# Patient Record
Sex: Female | Born: 1965 | Race: Black or African American | Hispanic: No | Marital: Single | Smoking: Current every day smoker
Health system: Southern US, Community
[De-identification: ages and names within clinical notes are randomized; demographics above are authoritative.]

## PROBLEM LIST (undated history)

## (undated) DIAGNOSIS — C210 Malignant neoplasm of anus, unspecified: Secondary | ICD-10-CM

## (undated) DIAGNOSIS — F32A Depression, unspecified: Secondary | ICD-10-CM

## (undated) DIAGNOSIS — M1990 Unspecified osteoarthritis, unspecified site: Secondary | ICD-10-CM

## (undated) DIAGNOSIS — F1110 Opioid abuse, uncomplicated: Secondary | ICD-10-CM

## (undated) DIAGNOSIS — Z720 Tobacco use: Secondary | ICD-10-CM

## (undated) DIAGNOSIS — M797 Fibromyalgia: Secondary | ICD-10-CM

## (undated) DIAGNOSIS — C801 Malignant (primary) neoplasm, unspecified: Secondary | ICD-10-CM

## (undated) DIAGNOSIS — I10 Essential (primary) hypertension: Secondary | ICD-10-CM

## (undated) DIAGNOSIS — F329 Major depressive disorder, single episode, unspecified: Secondary | ICD-10-CM

## (undated) HISTORY — PX: OTHER SURGICAL HISTORY: SHX169

---

## 2000-04-16 ENCOUNTER — Encounter: Admission: RE | Admit: 2000-04-16 | Discharge: 2000-04-16 | Payer: Self-pay | Admitting: General Surgery

## 2000-04-16 ENCOUNTER — Encounter: Payer: Self-pay | Admitting: General Surgery

## 2000-11-29 ENCOUNTER — Emergency Department (HOSPITAL_COMMUNITY): Admission: EM | Admit: 2000-11-29 | Discharge: 2000-11-29 | Payer: Self-pay | Admitting: Emergency Medicine

## 2001-07-28 ENCOUNTER — Encounter: Payer: Self-pay | Admitting: Emergency Medicine

## 2001-07-28 ENCOUNTER — Emergency Department (HOSPITAL_COMMUNITY): Admission: EM | Admit: 2001-07-28 | Discharge: 2001-07-28 | Payer: Self-pay | Admitting: Emergency Medicine

## 2001-09-02 ENCOUNTER — Emergency Department (HOSPITAL_COMMUNITY): Admission: EM | Admit: 2001-09-02 | Discharge: 2001-09-02 | Payer: Self-pay | Admitting: Emergency Medicine

## 2002-02-17 ENCOUNTER — Emergency Department (HOSPITAL_COMMUNITY): Admission: EM | Admit: 2002-02-17 | Discharge: 2002-02-17 | Payer: Self-pay | Admitting: Emergency Medicine

## 2004-12-18 ENCOUNTER — Other Ambulatory Visit: Admission: RE | Admit: 2004-12-18 | Discharge: 2004-12-18 | Payer: Self-pay | Admitting: Obstetrics & Gynecology

## 2005-12-30 ENCOUNTER — Other Ambulatory Visit: Admission: RE | Admit: 2005-12-30 | Discharge: 2005-12-30 | Payer: Self-pay | Admitting: Obstetrics & Gynecology

## 2007-01-25 ENCOUNTER — Other Ambulatory Visit: Admission: RE | Admit: 2007-01-25 | Discharge: 2007-01-25 | Payer: Self-pay | Admitting: Family Medicine

## 2007-02-03 ENCOUNTER — Encounter: Admission: RE | Admit: 2007-02-03 | Discharge: 2007-02-03 | Payer: Self-pay | Admitting: Family Medicine

## 2007-03-04 ENCOUNTER — Encounter: Admission: RE | Admit: 2007-03-04 | Discharge: 2007-03-04 | Payer: Self-pay | Admitting: Family Medicine

## 2009-01-29 ENCOUNTER — Other Ambulatory Visit: Admission: RE | Admit: 2009-01-29 | Discharge: 2009-01-29 | Payer: Self-pay | Admitting: Family Medicine

## 2010-05-17 ENCOUNTER — Ambulatory Visit (HOSPITAL_COMMUNITY): Admission: RE | Admit: 2010-05-17 | Discharge: 2010-05-17 | Payer: Self-pay | Admitting: Obstetrics & Gynecology

## 2010-07-24 ENCOUNTER — Other Ambulatory Visit: Admission: RE | Admit: 2010-07-24 | Discharge: 2010-07-24 | Payer: Self-pay | Admitting: Family Medicine

## 2011-01-11 LAB — CBC
HCT: 28.2 % — ABNORMAL LOW (ref 36.0–46.0)
Hemoglobin: 9.3 g/dL — ABNORMAL LOW (ref 12.0–15.0)
MCH: 27.6 pg (ref 26.0–34.0)
MCHC: 33 g/dL (ref 30.0–36.0)
MCV: 83.6 fL (ref 78.0–100.0)
Platelets: 224 10*3/uL (ref 150–400)
RBC: 3.37 MIL/uL — ABNORMAL LOW (ref 3.87–5.11)
RDW: 14.2 % (ref 11.5–15.5)
WBC: 5.4 10*3/uL (ref 4.0–10.5)

## 2011-02-21 ENCOUNTER — Other Ambulatory Visit: Payer: Self-pay | Admitting: Family Medicine

## 2011-02-21 DIAGNOSIS — Z1231 Encounter for screening mammogram for malignant neoplasm of breast: Secondary | ICD-10-CM

## 2011-02-24 ENCOUNTER — Ambulatory Visit
Admission: RE | Admit: 2011-02-24 | Discharge: 2011-02-24 | Disposition: A | Discharge status: Home / Self Care | Payer: 59 | Source: Ambulatory Visit | Attending: Family Medicine | Admitting: Family Medicine

## 2011-02-24 DIAGNOSIS — Z1231 Encounter for screening mammogram for malignant neoplasm of breast: Secondary | ICD-10-CM

## 2011-02-26 ENCOUNTER — Other Ambulatory Visit: Payer: Self-pay | Admitting: Family Medicine

## 2011-02-26 DIAGNOSIS — R928 Other abnormal and inconclusive findings on diagnostic imaging of breast: Secondary | ICD-10-CM

## 2011-03-03 ENCOUNTER — Ambulatory Visit
Admission: RE | Admit: 2011-03-03 | Discharge: 2011-03-03 | Disposition: A | Discharge status: Home / Self Care | Payer: 59 | Source: Ambulatory Visit | Attending: Family Medicine | Admitting: Family Medicine

## 2011-03-03 ENCOUNTER — Other Ambulatory Visit: Payer: Self-pay | Admitting: Family Medicine

## 2011-03-03 DIAGNOSIS — R928 Other abnormal and inconclusive findings on diagnostic imaging of breast: Secondary | ICD-10-CM

## 2011-06-09 ENCOUNTER — Inpatient Hospital Stay (INDEPENDENT_AMBULATORY_CARE_PROVIDER_SITE_OTHER)
Admission: RE | Admit: 2011-06-09 | Discharge: 2011-06-09 | Disposition: A | Discharge status: Home / Self Care | Payer: 59 | Source: Ambulatory Visit | Attending: Emergency Medicine | Admitting: Emergency Medicine

## 2011-06-09 DIAGNOSIS — R5381 Other malaise: Secondary | ICD-10-CM

## 2011-06-09 DIAGNOSIS — R5383 Other fatigue: Secondary | ICD-10-CM

## 2011-06-09 DIAGNOSIS — F411 Generalized anxiety disorder: Secondary | ICD-10-CM

## 2011-06-09 LAB — POCT I-STAT, CHEM 8
BUN: 12 mg/dL (ref 6–23)
Calcium, Ion: 1.13 mmol/L (ref 1.12–1.32)
HCT: 38 % (ref 36.0–46.0)
Hemoglobin: 12.9 g/dL (ref 12.0–15.0)
Sodium: 139 mEq/L (ref 135–145)
TCO2: 24 mmol/L (ref 0–100)

## 2011-06-09 LAB — POCT URINALYSIS DIP (DEVICE)
Ketones, ur: NEGATIVE mg/dL
Leukocytes, UA: NEGATIVE
Nitrite: NEGATIVE
Protein, ur: NEGATIVE mg/dL
pH: 5.5 (ref 5.0–8.0)

## 2011-08-21 ENCOUNTER — Other Ambulatory Visit: Payer: Self-pay | Admitting: Family Medicine

## 2011-08-21 DIAGNOSIS — R921 Mammographic calcification found on diagnostic imaging of breast: Secondary | ICD-10-CM

## 2011-10-14 ENCOUNTER — Ambulatory Visit
Admission: RE | Admit: 2011-10-14 | Discharge: 2011-10-14 | Disposition: A | Discharge status: Home / Self Care | Payer: 59 | Source: Ambulatory Visit | Attending: Family Medicine | Admitting: Family Medicine

## 2011-10-14 ENCOUNTER — Other Ambulatory Visit: Payer: Self-pay | Admitting: Family Medicine

## 2011-10-14 DIAGNOSIS — R921 Mammographic calcification found on diagnostic imaging of breast: Secondary | ICD-10-CM

## 2011-10-27 ENCOUNTER — Ambulatory Visit
Admission: RE | Admit: 2011-10-27 | Discharge: 2011-10-27 | Disposition: A | Discharge status: Home / Self Care | Payer: 59 | Source: Ambulatory Visit | Attending: Family Medicine | Admitting: Family Medicine

## 2011-10-27 DIAGNOSIS — R921 Mammographic calcification found on diagnostic imaging of breast: Secondary | ICD-10-CM

## 2012-04-15 ENCOUNTER — Other Ambulatory Visit: Payer: Self-pay | Admitting: Family Medicine

## 2012-04-15 DIAGNOSIS — R921 Mammographic calcification found on diagnostic imaging of breast: Secondary | ICD-10-CM

## 2012-06-30 ENCOUNTER — Ambulatory Visit
Admission: RE | Admit: 2012-06-30 | Discharge: 2012-06-30 | Disposition: A | Discharge status: Home / Self Care | Payer: 59 | Source: Ambulatory Visit | Attending: Family Medicine | Admitting: Family Medicine

## 2012-06-30 DIAGNOSIS — R921 Mammographic calcification found on diagnostic imaging of breast: Secondary | ICD-10-CM

## 2012-07-19 ENCOUNTER — Other Ambulatory Visit (HOSPITAL_COMMUNITY)
Admission: RE | Admit: 2012-07-19 | Discharge: 2012-07-19 | Disposition: A | Discharge status: Home / Self Care | Payer: 59 | Source: Ambulatory Visit | Attending: Family Medicine | Admitting: Family Medicine

## 2012-07-19 ENCOUNTER — Other Ambulatory Visit: Payer: Self-pay | Admitting: Family Medicine

## 2012-07-19 DIAGNOSIS — Z124 Encounter for screening for malignant neoplasm of cervix: Secondary | ICD-10-CM | POA: Insufficient documentation

## 2012-09-22 ENCOUNTER — Encounter (HOSPITAL_COMMUNITY): Payer: Self-pay | Admitting: *Deleted

## 2012-09-22 ENCOUNTER — Emergency Department (HOSPITAL_COMMUNITY): Payer: 59

## 2012-09-22 ENCOUNTER — Observation Stay (HOSPITAL_COMMUNITY)
Admission: EM | Admit: 2012-09-22 | Discharge: 2012-09-22 | Disposition: A | Discharge status: Home / Self Care | Payer: 59 | Attending: Internal Medicine | Admitting: Internal Medicine

## 2012-09-22 ENCOUNTER — Encounter (HOSPITAL_COMMUNITY)
Admission: EM | Disposition: A | Discharge status: Home / Self Care | Payer: Self-pay | Source: Home / Self Care | Attending: Emergency Medicine

## 2012-09-22 DIAGNOSIS — Z720 Tobacco use: Secondary | ICD-10-CM | POA: Diagnosis present

## 2012-09-22 DIAGNOSIS — R079 Chest pain, unspecified: Secondary | ICD-10-CM

## 2012-09-22 DIAGNOSIS — Z8249 Family history of ischemic heart disease and other diseases of the circulatory system: Secondary | ICD-10-CM

## 2012-09-22 DIAGNOSIS — R0789 Other chest pain: Principal | ICD-10-CM | POA: Insufficient documentation

## 2012-09-22 DIAGNOSIS — F17200 Nicotine dependence, unspecified, uncomplicated: Secondary | ICD-10-CM | POA: Insufficient documentation

## 2012-09-22 HISTORY — DX: Tobacco use: Z72.0

## 2012-09-22 HISTORY — DX: Unspecified osteoarthritis, unspecified site: M19.90

## 2012-09-22 HISTORY — DX: Fibromyalgia: M79.7

## 2012-09-22 HISTORY — PX: LEFT HEART CATHETERIZATION WITH CORONARY ANGIOGRAM: SHX5451

## 2012-09-22 LAB — LIPID PANEL
Cholesterol: 143 mg/dL (ref 0–200)
Triglycerides: 55 mg/dL (ref <150)
VLDL: 11 mg/dL (ref 0–40)

## 2012-09-22 LAB — CBC
HCT: 34.2 % — ABNORMAL LOW (ref 36.0–46.0)
Hemoglobin: 11.7 g/dL — ABNORMAL LOW (ref 12.0–15.0)
MCH: 29.5 pg (ref 26.0–34.0)
MCHC: 34.2 g/dL (ref 30.0–36.0)
MCV: 86.4 fL (ref 78.0–100.0)

## 2012-09-22 LAB — COMPREHENSIVE METABOLIC PANEL
ALT: 16 U/L (ref 0–35)
AST: 27 U/L (ref 0–37)
Albumin: 3.8 g/dL (ref 3.5–5.2)
Alkaline Phosphatase: 64 U/L (ref 39–117)
Calcium: 9.6 mg/dL (ref 8.4–10.5)
GFR calc Af Amer: 82 mL/min — ABNORMAL LOW (ref >90)
Potassium: 3.8 mEq/L (ref 3.5–5.1)
Sodium: 139 mEq/L (ref 135–145)
Total Protein: 7 g/dL (ref 6.0–8.3)

## 2012-09-22 LAB — TROPONIN I
Troponin I: <0.3 ng/mL (ref <0.30)
Troponin I: <0.3 ng/mL (ref <0.30)
Troponin I: <0.3 ng/mL (ref <0.30)

## 2012-09-22 LAB — CBC WITH DIFFERENTIAL/PLATELET
Basophils Absolute: 0 10*3/uL (ref 0.0–0.1)
Basophils Relative: 0 % (ref 0–1)
Eosinophils Relative: 4 % (ref 0–5)
HCT: 38.5 % (ref 36.0–46.0)
Hemoglobin: 12.9 g/dL (ref 12.0–15.0)
MCHC: 33.5 g/dL (ref 30.0–36.0)
MCV: 86.9 fL (ref 78.0–100.0)
Monocytes Absolute: 0.6 10*3/uL (ref 0.1–1.0)
Monocytes Relative: 8 % (ref 3–12)
Neutro Abs: 3.8 10*3/uL (ref 1.7–7.7)
RDW: 14.1 % (ref 11.5–15.5)

## 2012-09-22 LAB — PROTIME-INR: INR: 0.98 (ref 0.00–1.49)

## 2012-09-22 LAB — CREATININE, SERUM
GFR calc Af Amer: >90 mL/min (ref >90)
GFR calc non Af Amer: 80 mL/min — ABNORMAL LOW (ref >90)

## 2012-09-22 SURGERY — LEFT HEART CATHETERIZATION WITH CORONARY ANGIOGRAM
Anesthesia: LOCAL

## 2012-09-22 MED ORDER — NITROGLYCERIN 2 % TD OINT
0.5000 [in_us] | TOPICAL_OINTMENT | Freq: Four times a day (QID) | TRANSDERMAL | Status: DC
  Filled 2012-09-22: qty 30

## 2012-09-22 MED ORDER — ONDANSETRON HCL 4 MG/2ML IJ SOLN: 4.0000 mg | Freq: Four times a day (QID) | INTRAMUSCULAR | Status: DC | PRN

## 2012-09-22 MED ORDER — METOPROLOL TARTRATE 25 MG PO TABS: 12.5000 mg | ORAL_TABLET | Freq: Two times a day (BID) | ORAL | Status: DC

## 2012-09-22 MED ORDER — METOPROLOL TARTRATE 25 MG PO TABS
25.0000 mg | ORAL_TABLET | Freq: Two times a day (BID) | ORAL | Status: DC
  Administered 2012-09-22: 25 mg via ORAL
  Filled 2012-09-22 (×2): qty 1

## 2012-09-22 MED ORDER — SODIUM CHLORIDE 0.9 % IJ SOLN: 3.0000 mL | INTRAMUSCULAR | Status: DC | PRN

## 2012-09-22 MED ORDER — SODIUM CHLORIDE 0.9 % IJ SOLN
3.0000 mL | INTRAMUSCULAR | Status: DC | PRN
  Administered 2012-09-22: 3 mL via INTRAVENOUS

## 2012-09-22 MED ORDER — MIDAZOLAM HCL 2 MG/2ML IJ SOLN
INTRAMUSCULAR | Status: AC
  Filled 2012-09-22: qty 2

## 2012-09-22 MED ORDER — HEPARIN SODIUM (PORCINE) 5000 UNIT/ML IJ SOLN
5000.0000 [IU] | Freq: Three times a day (TID) | INTRAMUSCULAR | Status: DC
  Filled 2012-09-22 (×4): qty 1

## 2012-09-22 MED ORDER — ACETAMINOPHEN 325 MG PO TABS: 650.0000 mg | ORAL_TABLET | ORAL | Status: DC | PRN

## 2012-09-22 MED ORDER — SODIUM CHLORIDE 0.9 % IJ SOLN: 3.0000 mL | Freq: Two times a day (BID) | INTRAMUSCULAR | Status: DC

## 2012-09-22 MED ORDER — ASPIRIN 81 MG PO TBEC: 81.0000 mg | DELAYED_RELEASE_TABLET | Freq: Every day | ORAL | Status: DC

## 2012-09-22 MED ORDER — SODIUM CHLORIDE 0.9 % IV SOLN
250.0000 mL | INTRAVENOUS | Status: DC
  Administered 2012-09-22: 250 mL via INTRAVENOUS

## 2012-09-22 MED ORDER — FENTANYL CITRATE 0.05 MG/ML IJ SOLN
INTRAMUSCULAR | Status: AC
Start: 2012-09-22 — End: 2012-09-22
  Filled 2012-09-22: qty 2

## 2012-09-22 MED ORDER — DIAZEPAM 5 MG PO TABS
5.0000 mg | ORAL_TABLET | ORAL | Status: AC
  Administered 2012-09-22: 5 mg via ORAL
  Filled 2012-09-22: qty 1

## 2012-09-22 MED ORDER — ASPIRIN 300 MG RE SUPP: 300.0000 mg | RECTAL | Status: AC

## 2012-09-22 MED ORDER — SODIUM CHLORIDE 0.9 % IV SOLN: 250.0000 mL | INTRAVENOUS | Status: DC | PRN

## 2012-09-22 MED ORDER — NITROGLYCERIN 0.4 MG SL SUBL
0.4000 mg | SUBLINGUAL_TABLET | Freq: Once | SUBLINGUAL | Status: AC
  Administered 2012-09-22: 0.4 mg via SUBLINGUAL

## 2012-09-22 MED ORDER — ASPIRIN EC 81 MG PO TBEC: 81.0000 mg | DELAYED_RELEASE_TABLET | Freq: Every day | ORAL | Status: DC

## 2012-09-22 MED ORDER — LORAZEPAM 1 MG PO TABS: 1.0000 mg | ORAL_TABLET | Freq: Every evening | ORAL | Status: DC | PRN

## 2012-09-22 MED ORDER — SODIUM CHLORIDE 0.9 % IV SOLN: INTRAVENOUS | Status: DC

## 2012-09-22 MED ORDER — PREGABALIN 75 MG PO CAPS
75.0000 mg | ORAL_CAPSULE | Freq: Two times a day (BID) | ORAL | Status: DC
  Administered 2012-09-22 (×2): 75 mg via ORAL
  Filled 2012-09-22 (×2): qty 1

## 2012-09-22 MED ORDER — NITROGLYCERIN 0.4 MG SL SUBL: 0.4000 mg | SUBLINGUAL_TABLET | SUBLINGUAL | Status: DC | PRN

## 2012-09-22 MED ORDER — ASPIRIN 81 MG PO CHEW
324.0000 mg | CHEWABLE_TABLET | ORAL | Status: AC
  Administered 2012-09-22: 324 mg via ORAL
  Filled 2012-09-22: qty 4

## 2012-09-22 MED ORDER — CLONAZEPAM 1 MG PO TABS: 1.0000 mg | ORAL_TABLET | Freq: Two times a day (BID) | ORAL | Status: DC | PRN

## 2012-09-22 MED ORDER — NICOTINE 21 MG/24HR TD PT24
21.0000 mg | MEDICATED_PATCH | Freq: Every day | TRANSDERMAL | Status: DC
  Filled 2012-09-22 (×2): qty 1

## 2012-09-22 NOTE — ED Notes (Signed)
Attempted to call report x1, stated they would call back 

## 2012-09-22 NOTE — ED Provider Notes (Signed)
History     CSN: [id number]  Arrival date & time 09/22/12  0051   First MD Initiated Contact with Patient 09/22/12 0056      Chief Complaint  Patient presents with  . Chest Pain    (Consider location/radiation/quality/duration/timing/severity/associated sxs/prior treatment) Patient is a 46 y.o. female presenting with chest pain. The history is provided by the patient.  Chest Pain Primary symptoms include nausea. Pertinent negatives for primary symptoms include no shortness of breath, no abdominal pain and no vomiting.  Pertinent negatives for associated symptoms include no numbness and no weakness.    patient woke with chest pain tonight. She states it radiates to her left arm. He's had a little bit of nausea. She describes it as a pressure. She has not had pains like this before. She does smoke. No trouble breathing. No recent travel. She did have a cold a couple weeks ago. She states that has cleared up. She has a strong family history of early cardiac disease. She herself has never been seen for chest pain or other cardiac problems. Pain is not worse with movement or deep breath. No swelling or legs. No recent travel.  Past Medical History  Diagnosis Date  . Fibromyalgia   . Arthritis     Past Surgical History  Procedure Date  . Uterine ablation     Family History  Problem Relation Age of Onset  . Heart attack      History  Substance Use Topics  . Smoking status: Current Every Day Smoker -- 1.0 packs/day    Types: Cigarettes  . Smokeless tobacco: Not on file  . Alcohol Use: No    OB History    Grav Para Term Preterm Abortions TAB SAB Ect Mult Living                  Review of Systems  Constitutional: Negative for activity change and appetite change.  HENT: Negative for neck stiffness.   Eyes: Negative for pain.  Respiratory: Negative for chest tightness and shortness of breath.   Cardiovascular: Positive for chest pain. Negative for leg swelling.    Gastrointestinal: Positive for nausea. Negative for vomiting, abdominal pain and diarrhea.  Genitourinary: Negative for flank pain.  Musculoskeletal: Negative for back pain.  Skin: Negative for rash.  Neurological: Negative for weakness, numbness and headaches.  Psychiatric/Behavioral: Negative for behavioral problems.    Allergies  Vicodin  Home Medications   Current Outpatient Rx  Name  Route  Sig  Dispense  Refill  . CLONAZEPAM 1 MG PO TABS   Oral   Take 1 mg by mouth 2 (two) times daily as needed. For anxiety         . LORAZEPAM 1 MG PO TABS   Oral   Take 1 mg by mouth at bedtime as needed. For sleep         . PREGABALIN 75 MG PO CAPS   Oral   Take 75 mg by mouth 2 (two) times daily.           BP 116/78  Pulse 79  Temp 97.6 F (36.4 C) (Oral)  Resp 19  Ht 5' 8.25" (1.734 m)  Wt 156 lb (70.761 kg)  BMI 23.55 kg/m2  SpO2 97%  Physical Exam  Nursing note and vitals reviewed. Constitutional: She is oriented to person, place, and time. She appears well-developed and well-nourished.  HENT:  Head: Normocephalic and atraumatic.  Eyes: EOM are normal. Pupils are equal, round, and reactive to  light.  Neck: Normal range of motion. Neck supple.  Cardiovascular: Normal rate, regular rhythm and normal heart sounds.   No murmur heard. Pulmonary/Chest: Effort normal and breath sounds normal. No respiratory distress. She has no wheezes. She has no rales.  Abdominal: Soft. Bowel sounds are normal. She exhibits no distension. There is no tenderness. There is no rebound and no guarding.  Musculoskeletal: Normal range of motion.  Neurological: She is alert and oriented to person, place, and time. No cranial nerve deficit.  Skin: Skin is warm and dry.  Psychiatric: She has a normal mood and affect. Her speech is normal.    ED Course  Procedures (including critical care time)  Labs Reviewed  COMPREHENSIVE METABOLIC PANEL - Abnormal; Notable for the following:    GFR  calc non Af Amer 71 (*)     GFR calc Af Amer 82 (*)     All other components within normal limits  CBC WITH DIFFERENTIAL - Abnormal; Notable for the following:    Platelets 147 (*)     All other components within normal limits  POCT I-STAT TROPONIN I   Dg Chest 2 View  09/22/2012  *RADIOLOGY REPORT*  Clinical Data: Left-sided chest pain.  CHEST - 2 VIEW  Comparison: Chest radiograph performed 03/04/2007  Findings: The lungs are well-aerated and clear.  There is no evidence of focal opacification, pleural effusion or pneumothorax.  The heart is normal in size; the mediastinal contour is within normal limits.  No acute osseous abnormalities are seen.  IMPRESSION: No acute cardiopulmonary process seen.   Original Report Authenticated By: Tonia Ghent, M.D.      1. Chest pain      Date: 09/22/2012  Rate: 78  Rhythm: normal sinus rhythm  QRS Axis: normal  Intervals: normal  ST/T Wave abnormalities: normal  Conduction Disutrbances:none  Narrative Interpretation:   Old EKG Reviewed: none available    MDM  Patient with chest pain. EKG is reassuring. Strong family history of early cardiac disease. She's also smoker. She is now pain-free after nitroglycerin. She'll be admitted to medicine.         Juliet Rude. Rubin Payor, MD 09/22/12 1610

## 2012-09-22 NOTE — Progress Notes (Signed)
[facility] PROGRESS NOTE  Bonnie Douglas EAV:[id number] DOB: 08-28-1966 DOA: 09/22/2012 PCP: No primary provider on file.  Assessment/Plan  CP  Resolved. Appreciate [facility] consultation Patient going for Cardiac Cath today. If Cath is clean patient desires discharge today (even if its midnight tonight)  Tobacco Abuse Counseled to quit Nicotine Patch inpatient.  Constipation Patient reports this is her normal Stool softeners when able to eat.  Code Status: full code Family Communication:  Disposition Plan: To home when appropriate.   Consultants:  [facility]  Procedures:  Considering Cath.     HPI/Subjective: Bonnie Douglas is a 46 y.o. female who presented to ED with c/o chest pain-described as chest pressure. Onset 09/22/12 at 1230 AM that woke her up from sleep. Pressure like quality, located in L chest with radiation to L arm and L jaw, improved with 1 NTG in ED. Associated symptoms of nausea, mild diaphoresis, no SOB. Currently no chest pain but Lt shoulder pain.  Patients cardiac risk factors are significant and include a 1 PPD smoking habit since age 45, and a strong family history of CAD including a father with MI at age 12, and a brother who had 3 vessel CABG at age 35 (1 year older than patient is now). Brothers CAD presented as triple vessel disease and was diagnosed via heart cath after he had a normal stress test. Her maternal uncle with CAD   Objective: Filed Vitals:   09/22/12 0300 09/22/12 0315 09/22/12 0330 09/22/12 0406  BP: 124/85 115/74 118/78 126/83  Pulse: 76 76 69 72  Temp:    98.5 F (36.9 C)  TempSrc:    Oral  Resp: 16 13 13    Height:    5' 8.25" (1.734 m)  Weight:    70.761 kg (156 lb)  SpO2: 98% 97% 98% 100%    Intake/Output Summary (Last 24 hours) at 09/22/12 0904 Last data filed at 09/22/12 0843  Gross per 24 hour  Intake    250 ml  Output      0 ml  Net    250 ml   Filed Weights   09/22/12 0055 09/22/12 0406  Weight: 70.761 kg  (156 lb) 70.761 kg (156 lb)    Exam:  General:  A&O, NAD, Sitting up in bed. Cardiovascular: RRR, no M/R/G, No lower extremity edema Respiratory: CTA, no W/C/R Abdomen: Thin, soft, NT, ND, No masses Neuro:  Non focal, CN 2 - 12 grossly intact Psych:  A&O, Cooperative, Moderately groomed,  Appears slightly anxious, slightly agitated that she cant smoke.   Data Reviewed: Basic Metabolic Panel:  Lab 09/22/12 7829 09/22/12 0101  NA -- 139  K -- 3.8  CL -- 104  CO2 -- 25  GLUCOSE -- 90  BUN -- 18  CREATININE 0.85 0.94  CALCIUM -- 9.6  MG -- --  PHOS -- --   Liver Function Tests:  Lab 09/22/12 0101  AST 27  ALT 16  ALKPHOS 64  BILITOT 0.3  PROT 7.0  ALBUMIN 3.8   CBC:  Lab 09/22/12 0318 09/22/12 0101  WBC 6.3 8.0  NEUTROABS -- 3.8  HGB 11.7* 12.9  HCT 34.2* 38.5  MCV 86.4 86.9  PLT 137* 147*   Cardiac Enzymes:  Lab 09/22/12 0317  CKTOTAL --  CKMB --  CKMBINDEX --  TROPONINI <0.30     Studies: Dg Chest 2 View  09/22/2012  *RADIOLOGY REPORT*  Clinical Data: Left-sided chest pain.  CHEST - 2 VIEW  Comparison: Chest radiograph performed  03/04/2007  Findings: The lungs are well-aerated and clear.  There is no evidence of focal opacification, pleural effusion or pneumothorax.  The heart is normal in size; the mediastinal contour is within normal limits.  No acute osseous abnormalities are seen.  IMPRESSION: No acute cardiopulmonary process seen.   Original Report Authenticated By: Tonia Ghent, M.D.     Scheduled Meds:   . [COMPLETED] aspirin  324 mg Oral NOW   Or  . [COMPLETED] aspirin  300 mg Rectal NOW  . aspirin EC  81 mg Oral Daily  . heparin  5,000 Units Subcutaneous Q8H  . [COMPLETED] nitroGLYCERIN  0.4 mg Sublingual Once  . pregabalin  75 mg Oral BID  . sodium chloride  3 mL Intravenous Q12H   Continuous Infusions:   Principal Problem:  *Chest pain, with multiple risk factors for CAD Active Problems:  Tobacco abuse  Family history of  premature CAD    Time spent: 20 min.   Conley Canal  [facility] Pager [phone]. If 8PM-8AM, please contact night-coverage at [email or web address], password [facility] 09/22/2012, 9:04 AM  LOS: 0 days

## 2012-09-22 NOTE — ED Notes (Signed)
Pt in c/o epigastric/lower sternal chest pain that woke her from sleep, pt also noted pain to left arm and states neck felt stiff. Pt denies pain at this time but c/o "discomfort", denies shortness of breath, episodes of nausea. Pt has no cardiac history but has strong familial history. Pt to radiology after arrival to room.

## 2012-09-22 NOTE — CV Procedure (Signed)
Cardiac Catheterization  Bonnie Douglas, 46 y.o., female  Full note dictated; see diagram  DICTATION # [id number], [id number]  AO: 107/72 LV: 107/14  LM: nl LAD: twin vessel LAD/DX without obstruction LCX: nl RCA: large dominant nl vessel, very diminutive distal branch of PLA fill via collerals from very small distal PD2 branch.  NL LV Fxn: EF 55%.  Medical therapy.  Smoking cessation discussed. Tolerated well.  Lennette Bihari, MD, [facility] 09/22/2012 3:04 PM

## 2012-09-22 NOTE — Progress Notes (Signed)
Nutrition Brief Note  Patient identified on the Malnutrition Screening Tool (MST) Report with score of 2.   Body mass index is 23.55 kg/(m^2). Pt meets criteria for normal weight based on current BMI.   Current diet order is NPO, patient is consuming approximately N/A % of meals at this time. Labs and medications reviewed.   Patient reports she had a decrease in appetite several weeks ago due to a seasonal cold. However, her appetite is normally good. She does report a 5 pound weight loss over 1-2 months, but this is not significant.   No nutrition interventions warranted at this time. If nutrition issues arise, please consult RD.   Linnell Fulling, RD, LDN Pager #: [phone] After-Hours Pager #: [phone]

## 2012-09-22 NOTE — Progress Notes (Signed)
Pt ambulated around room.  Tolerated well and cath site remained stable.  Pressure dressing removed and band-aid applied to cath site.  Pt verbalizes no questions or concerns.  Pt d/c'ed to home with daughter.

## 2012-09-22 NOTE — Progress Notes (Signed)
Pt returned to unit from cath lab. VSS, pt resting and comfortable. Will continue to monitor.

## 2012-09-22 NOTE — ED Notes (Signed)
MD Pickering at bedside assessing patient.

## 2012-09-22 NOTE — ED Notes (Signed)
Pt felt L sided chest tightness.  She went to bed and was awoken from sleep with more intense pain that radiated to L arm.  Pt c/o nausea but denies sob.  NO cardiac hx but sates familial cardiac hx.

## 2012-09-22 NOTE — Discharge Summary (Signed)
Physician Discharge Summary  Bonnie Douglas NFA:[id number] DOB: 1966/03/03 DOA: 09/22/2012  PCP: No primary provider on file.  Admit date: 09/22/2012 Discharge date: 09/23/2012  Time spent: 35 minutes  Recommendations for Outpatient Follow-up:  Stop smoking   Discharge Diagnoses:  Principal Problem:  *Chest pain, with multiple risk factors for CAD Active Problems:  Tobacco abuse  Family history of premature CAD   Discharge Condition: improved  Diet recommendation: cardiac  Filed Weights   09/22/12 0055 09/22/12 0406  Weight: 70.761 kg (156 lb) 70.761 kg (156 lb)    History of present illness:  Bonnie Douglas is a 46 y.o. female who presents with c/o chest pain. Onset earlier this evening woke her up from sleep. Pressure like quality, located in L chest with radiation to L arm and L jaw, improved with 1 NTG in ED.  Patients cardiac risk factors are significant and include a 1 PPD smoking habit since age 75, and a strong family history of CAD including a father with MI at age 74, and a brother who had 3 vessel CABG at age 10 (1 year older than patient is now). Brothers CAD presented as triple vessel disease and was diagnosed via heart cath after he had a normal stress test. Hospitalist has been asked to admit for cardiac ruleout   Hospital Course:  CP- had a heart cath, clean- medical management- needs to stop smoking  Procedures:  cath  Consultations:  cardiology  Discharge Exam: Filed Vitals:   09/22/12 1635 09/22/12 1705 09/22/12 1735 09/22/12 1840  BP: 98/61 100/57 103/74 111/68  Pulse: 61 68 64 69  Temp:      TempSrc:      Resp:      Height:      Weight:      SpO2:        General: A+Ox3, NAD Cardiovascular: rrr Respiratory: clear anterior  Discharge Instructions      Discharge Orders    Future Orders Please Complete By Expires   Diet - low sodium heart healthy      Increase activity slowly      Discharge instructions      Comments:   Stop  smoking       Medication List     As of 09/23/2012  1:41 PM    TAKE these medications         aspirin 81 MG EC tablet   Take 1 tablet (81 mg total) by mouth daily.      clonazePAM 1 MG tablet   Commonly known as: KLONOPIN   Take 1 mg by mouth 2 (two) times daily as needed. For anxiety      LORazepam 1 MG tablet   Commonly known as: ATIVAN   Take 1 mg by mouth at bedtime as needed. For sleep      metoprolol tartrate 25 MG tablet   Commonly known as: LOPRESSOR   Take 0.5 tablets (12.5 mg total) by mouth 2 (two) times daily.      pregabalin 75 MG capsule   Commonly known as: LYRICA   Take 75 mg by mouth 2 (two) times daily.        Follow-up Information    Please follow up. (pcp in 1 month)           The results of significant diagnostics from this hospitalization (including imaging, microbiology, ancillary and laboratory) are listed below for reference.    Significant Diagnostic Studies: Dg Chest 2 View  09/22/2012  *  RADIOLOGY REPORT*  Clinical Data: Left-sided chest pain.  CHEST - 2 VIEW  Comparison: Chest radiograph performed 03/04/2007  Findings: The lungs are well-aerated and clear.  There is no evidence of focal opacification, pleural effusion or pneumothorax.  The heart is normal in size; the mediastinal contour is within normal limits.  No acute osseous abnormalities are seen.  IMPRESSION: No acute cardiopulmonary process seen.   Original Report Authenticated By: Tonia Ghent, M.D.     Microbiology: No results found for this or any previous visit (from the past 240 hour(s)).   Labs: Basic Metabolic Panel:  Lab 09/22/12 1610 09/22/12 0317 09/22/12 0101  NA -- -- 139  K -- -- 3.8  CL -- -- 104  CO2 -- -- 25  GLUCOSE -- -- 90  BUN -- -- 18  CREATININE -- 0.85 0.94  CALCIUM -- -- 9.6  MG 1.8 -- --  PHOS -- -- --   Liver Function Tests:  Lab 09/22/12 0101  AST 27  ALT 16  ALKPHOS 64  BILITOT 0.3  PROT 7.0  ALBUMIN 3.8   No results found for  this basename: LIPASE:5,AMYLASE:5 in the last 168 hours No results found for this basename: AMMONIA:5 in the last 168 hours CBC:  Lab 09/22/12 0318 09/22/12 0101  WBC 6.3 8.0  NEUTROABS -- 3.8  HGB 11.7* 12.9  HCT 34.2* 38.5  MCV 86.4 86.9  PLT 137* 147*   Cardiac Enzymes:  Lab 09/22/12 1556 09/22/12 0914 09/22/12 0317  CKTOTAL -- -- --  CKMB -- -- --  CKMBINDEX -- -- --  TROPONINI <0.30 <0.30 <0.30   BNP: BNP (last 3 results) No results found for this basename: PROBNP:3 in the last 8760 hours CBG: No results found for this basename: GLUCAP:5 in the last 168 hours     Signed:  Marlin Canary  [facility] 09/23/2012, 1:41 PM

## 2012-09-22 NOTE — Consult Note (Signed)
Reason for Consult: chest pain, nitrate responsive in female with  Premature family HX. CAD   Referring Physician: Dr. Heloise Beecham is an 46 y.o. female.    Chief Complaint: admitted earlier today with chest pain   HPI:  Bonnie Douglas is a 46 y.o. female who presented to ED  with c/o chest pain-described as chest pressure. Onset 09/22/12 at 1230 AM that woke her up from sleep.  Pressure like quality, located in L chest with radiation to L arm and L jaw, improved with 1 NTG in ED.  Associated symptoms of nausea, mild diaphoresis, no SOB.  Currently no chest pain but Lt shoulder pain.    Patients cardiac risk factors are significant and include a 1 PPD smoking habit since age 78, and a strong family history of CAD including a father with MI at age 55, and a brother who had 3 vessel CABG at age 83 (1 year older than patient is now). Brothers CAD presented as triple vessel disease and was diagnosed via heart cath after he had a normal stress test.  Her maternal uncle with CAD.    Past Medical History  Diagnosis Date  . Fibromyalgia   . Arthritis   . Tobacco abuse 09/22/2012    Past Surgical History  Procedure Date  . Uterine ablation     Family History  Problem Relation Age of Onset  . Heart attack     Social History:  reports that she has been smoking Cigarettes.  She has been smoking about 1 pack per day. She does not have any smokeless tobacco history on file. She reports that she does not drink alcohol or use illicit drugs.W, 2 children  Allergies:  Allergies  Allergen Reactions  . Vicodin (Hydrocodone-Acetaminophen) Other (See Comments)    heacache    Medications Prior to Admission  Medication Sig Dispense Refill  . clonazePAM (KLONOPIN) 1 MG tablet Take 1 mg by mouth 2 (two) times daily as needed. For anxiety      . LORazepam (ATIVAN) 1 MG tablet Take 1 mg by mouth at bedtime as needed. For sleep      . pregabalin (LYRICA) 75 MG capsule Take 75 mg by mouth 2  (two) times daily.        Results for orders placed during the hospital encounter of 09/22/12 (from the past 48 hour(s))  COMPREHENSIVE METABOLIC PANEL     Status: Abnormal   Collection Time   09/22/12  1:01 AM      Component Value Range Comment   Sodium 139  135 - 145 mEq/L    Potassium 3.8  3.5 - 5.1 mEq/L    Chloride 104  96 - 112 mEq/L    CO2 25  19 - 32 mEq/L    Glucose, Bld 90  70 - 99 mg/dL    BUN 18  6 - 23 mg/dL    Creatinine, Ser 1.61  0.50 - 1.10 mg/dL    Calcium 9.6  8.4 - 09.6 mg/dL    Total Protein 7.0  6.0 - 8.3 g/dL    Albumin 3.8  3.5 - 5.2 g/dL    AST 27  0 - 37 U/L    ALT 16  0 - 35 U/L    Alkaline Phosphatase 64  39 - 117 U/L    Total Bilirubin 0.3  0.3 - 1.2 mg/dL    GFR calc non Af Amer 71 (*) >90 mL/min    GFR calc Af  Amer 82 (*) >90 mL/min   CBC WITH DIFFERENTIAL     Status: Abnormal   Collection Time   09/22/12  1:01 AM      Component Value Range Comment   WBC 8.0  4.0 - 10.5 K/uL    RBC 4.43  3.87 - 5.11 MIL/uL    Hemoglobin 12.9  12.0 - 15.0 g/dL    HCT 16.1  09.6 - 04.5 %    MCV 86.9  78.0 - 100.0 fL    MCH 29.1  26.0 - 34.0 pg    MCHC 33.5  30.0 - 36.0 g/dL    RDW 40.9  81.1 - 91.4 %    Platelets 147 (*) 150 - 400 K/uL    Neutrophils Relative 48  43 - 77 %    Neutro Abs 3.8  1.7 - 7.7 K/uL    Lymphocytes Relative 41  12 - 46 %    Lymphs Abs 3.3  0.7 - 4.0 K/uL    Monocytes Relative 8  3 - 12 %    Monocytes Absolute 0.6  0.1 - 1.0 K/uL    Eosinophils Relative 4  0 - 5 %    Eosinophils Absolute 0.3  0.0 - 0.7 K/uL    Basophils Relative 0  0 - 1 %    Basophils Absolute 0.0  0.0 - 0.1 K/uL   POCT I-STAT TROPONIN I     Status: Normal   Collection Time   09/22/12  1:14 AM      Component Value Range Comment   Troponin i, poc 0.00  0.00 - 0.08 ng/mL    Comment 3            TROPONIN I     Status: Normal   Collection Time   09/22/12  3:17 AM      Component Value Range Comment   Troponin I <0.30  <0.30 ng/mL   CREATININE, SERUM     Status:  Abnormal   Collection Time   09/22/12  3:17 AM      Component Value Range Comment   Creatinine, Ser 0.85  0.50 - 1.10 mg/dL    GFR calc non Af Amer 80 (*) >90 mL/min    GFR calc Af Amer >90  >90 mL/min   LIPID PANEL     Status: Normal   Collection Time   09/22/12  3:18 AM      Component Value Range Comment   Cholesterol 143  0 - 200 mg/dL    Triglycerides 55  <782 mg/dL    HDL 43  >95 mg/dL    Total CHOL/HDL Ratio 3.3      VLDL 11  0 - 40 mg/dL    LDL Cholesterol 89  0 - 99 mg/dL   CBC     Status: Abnormal   Collection Time   09/22/12  3:18 AM      Component Value Range Comment   WBC 6.3  4.0 - 10.5 K/uL    RBC 3.96  3.87 - 5.11 MIL/uL    Hemoglobin 11.7 (*) 12.0 - 15.0 g/dL    HCT 62.1 (*) 30.8 - 46.0 %    MCV 86.4  78.0 - 100.0 fL    MCH 29.5  26.0 - 34.0 pg    MCHC 34.2  30.0 - 36.0 g/dL    RDW 65.7  84.6 - 96.2 %    Platelets 137 (*) 150 - 400 K/uL    Dg Chest 2 View  09/22/2012  *  RADIOLOGY REPORT*  Clinical Data: Left-sided chest pain.  CHEST - 2 VIEW  Comparison: Chest radiograph performed 03/04/2007  Findings: The lungs are well-aerated and clear.  There is no evidence of focal opacification, pleural effusion or pneumothorax.  The heart is normal in size; the mediastinal contour is within normal limits.  No acute osseous abnormalities are seen.  IMPRESSION: No acute cardiopulmonary process seen.   Original Report Authenticated By: Tonia Ghent, M.D.     ROS: General:no colds or fevers, no weight changes Skin:no rashes or ulcers HEENT:no blurred vision, no congestion CV:see HPI PUL:see HPI GI:no diarrhea constipation or melena, no indigestion, though some GI upset this AM GU:no hematuria, no dysuria MS:no joint pain, no claudication Neuro:no syncope, no lightheadedness, no hx CVA Endo:no diabetes, no thyroid disease   Blood pressure 126/83, pulse 72, temperature 98.5 F (36.9 C), temperature source Oral, resp. rate 13, height 5' 8.25" (1.734 m), weight 70.761 kg  (156 lb), SpO2 100.00%. PE: General:alert and oriented, pleasant affect, NAD . Skin:W&D, brisk capillary refill HEENT:normocephalic, sclera clear Neck:supple no JVD, no bruits Heart:S1S2 RRR no M,G, Douglas, Click Lungs:clear, without, rales occ rhonchi  Abd:+ BS, soft, non tender Ext:no edema, 2+ post tib bil. Neuro:alert and oriented X 3 MAE, follows commands    Assessment/Plan Principal Problem:  *Chest pain, with multiple risk factors for CAD Active Problems:  Tobacco abuse  Family history of premature CAD  PLAN: chest pain in 46 year old female with premature family hx CAD with Father at 49 yrs and brother at 96, she has tobacco history.currently on asa 81 mg, VTE prophylaxis. Currently NPO.   Discussed, stress test but pt does not believe in the test due to her brother's normal study and needing CABG.  ? Cath vs. Cardiac CT?  Shoulder pain continues, add NTG, MD to see.  Bonnie Douglas,Bonnie Douglas 09/22/2012, 8:17 AM   Patient seen and examined. Agree with assessment and plan. Very pleasant 50 female originally from [place] who presents today being awakenend grom sleep with significant chest pain. Pt has a 30 yr h/o tobacco use and very strong FH for premature CAD. ECG without significant changes. Initial cardiac markers are negative.  Discussed optoins with patient. With worrisome symptoms and risk factors recommend definitive cardiac catheterization. Discussed risks/benefit.  Will proceed for later today.   Bonnie Bihari, MD, [facility] 09/22/2012 9:05 AM

## 2012-09-22 NOTE — Progress Notes (Signed)
Patient seen and examined by me.  Admitted early this AM.  Plan is for cath today.  If normal, will plan to d/c after  Marlin Canary DO

## 2012-09-22 NOTE — Progress Notes (Signed)
Pt educated and informed of D/C information. Pt expresses understanding of new medications. Pt has no further questions or concerns. Ready for D/C with family.

## 2012-09-22 NOTE — Progress Notes (Signed)
Utilization review completed.  

## 2012-09-22 NOTE — H&P (Signed)
[facility] History and Physical  Bonnie Douglas:[id number] DOB: January 03, 1966 DOA: 09/22/2012  Referring physician: ED PCP: No primary provider on file.  Specialists: None  Chief Complaint: Chest pain  HPI: Bonnie Douglas is a 46 y.o. female who presents with c/o chest pain.  Onset earlier this evening woke her up from sleep.  Pressure like quality, located in L chest with radiation to L arm and L jaw, improved with 1 NTG in ED.  Patients cardiac risk factors are significant and include a 1 PPD smoking habit since age 8, and a strong family history of CAD including a father with MI at age 72, and a brother who had 3 vessel CABG at age 2 (1 year older than patient is now).  Brothers CAD presented as triple vessel disease and was diagnosed via heart cath after he had a normal stress test.  Hospitalist has been asked to admit for cardiac ruleout.  Review of Systems: no recent travel, is active and was at work yesterday without symptoms, 12 systems reviewed and otherwise negative.  Past Medical History  Diagnosis Date  . Fibromyalgia   . Arthritis    Past Surgical History  Procedure Date  . Uterine ablation    Social History:  reports that she has been smoking Cigarettes.  She has been smoking about 1 pack per day. She does not have any smokeless tobacco history on file. She reports that she does not drink alcohol or use illicit drugs.   Allergies  Allergen Reactions  . Vicodin (Hydrocodone-Acetaminophen) Other (See Comments)    heacache    Family History  Problem Relation Age of Onset  . Heart attack      Prior to Admission medications   Medication Sig Start Date End Date Taking? Authorizing Provider  clonazePAM (KLONOPIN) 1 MG tablet Take 1 mg by mouth 2 (two) times daily as needed. For anxiety   Yes Historical Provider, MD  LORazepam (ATIVAN) 1 MG tablet Take 1 mg by mouth at bedtime as needed. For sleep   Yes Historical Provider, MD  pregabalin (LYRICA) 75 MG  capsule Take 75 mg by mouth 2 (two) times daily.   Yes Historical Provider, MD   Physical Exam: Filed Vitals:   09/22/12 0130 09/22/12 0145 09/22/12 0200 09/22/12 0215  BP: 141/91 126/85 124/88 116/78  Pulse: 73 76 94 79  Temp:      TempSrc:      Resp: 15 13 18 19   Height:      Weight:      SpO2: 99% 97% 98% 97%    General:  NAD, resting comfortably in bed Eyes: PEERLA EOMI ENT: mucous membranes moist Neck: supple w/o JVD Cardiovascular: RRR w/o MRG Respiratory: few rales bilaterally Abdomen: soft, nt, nd, bs+ Skin: no rash nor lesion Musculoskeletal: MAE, full ROM all 4 extremities Psychiatric: normal tone and affect Neurologic: AAOx3, grossly non-focal  Labs on Admission:  Basic Metabolic Panel:  Lab 09/22/12 8119  NA 139  K 3.8  CL 104  CO2 25  GLUCOSE 90  BUN 18  CREATININE 0.94  CALCIUM 9.6  MG --  PHOS --   Liver Function Tests:  Lab 09/22/12 0101  AST 27  ALT 16  ALKPHOS 64  BILITOT 0.3  PROT 7.0  ALBUMIN 3.8   No results found for this basename: LIPASE:5,AMYLASE:5 in the last 168 hours No results found for this basename: AMMONIA:5 in the last 168 hours CBC:  Lab 09/22/12 0101  WBC 8.0  NEUTROABS 3.8  HGB 12.9  HCT 38.5  MCV 86.9  PLT 147*   Cardiac Enzymes: No results found for this basename: CKTOTAL:5,CKMB:5,CKMBINDEX:5,TROPONINI:5 in the last 168 hours  BNP (last 3 results) No results found for this basename: PROBNP:3 in the last 8760 hours CBG: No results found for this basename: GLUCAP:5 in the last 168 hours  Radiological Exams on Admission: Dg Chest 2 View  09/22/2012  *RADIOLOGY REPORT*  Clinical Data: Left-sided chest pain.  CHEST - 2 VIEW  Comparison: Chest radiograph performed 03/04/2007  Findings: The lungs are well-aerated and clear.  There is no evidence of focal opacification, pleural effusion or pneumothorax.  The heart is normal in size; the mediastinal contour is within normal limits.  No acute osseous abnormalities  are seen.  IMPRESSION: No acute cardiopulmonary process seen.   Original Report Authenticated By: Tonia Ghent, M.D.     EKG: Independently reviewed.  Assessment/Plan Principal Problem:  *Chest pain   1. CP - improved with nitro but am concerned about CAD in this patient with risk factors and a significant family history as noted in the HPI.  Admitting to hospital, likely needs cardiology consultation in AM, cards may wish to consider heart cath as evaluation given the concern (due to family history) for triple vessel disease.  With that said patient was active all day yesterday and had no symptoms  2. Smoking - is interested in quitting smoking but wants to try on her own first.   Code Status: Full Code (must indicate code status--if unknown or must be presumed, indicate so) Family Communication: no family at bedside (indicate person spoken with, if applicable, with phone number if by telephone) Disposition Plan: admit to obs (indicate anticipated LOS)  Time spent: 50 min  Promise Weldin M. [facility] Pager [phone]  If 7PM-7AM, please contact night-coverage [email or web address] Password [facility] 09/22/2012, 3:19 AM

## 2012-09-23 NOTE — Cardiovascular Report (Signed)
NAME:  Bonnie Douglas, Bonnie Douglas NO.:  [id number]  MEDICAL RECORD NO.:  [id number]  LOCATION:  [id number]                        FACILITY:  [facility]  PHYSICIAN:  Nicki Guadalajara, M.D.     DATE OF BIRTH:  [birth year]  DATE OF PROCEDURE:  09/22/2012 DATE OF DISCHARGE:  09/22/2012                           CARDIAC CATHETERIZATION   INDICATIONS:  Ms. Bonnie Douglas is a 46 year old female, who presented to [facility] after being awakened from sleep with chest tightness and pressure this morning.  She was admitted to the [facility].  Initial ECG was unremarkable.  I saw her in Cardiology consultation.  She does have a greater than 30-year history of tobacco use, and a very strong family history for premature coronary artery disease, both in her father and brother.  Her brother previously had a normal stress test and then required CABG surgery.  In light of her risk factor profile with worrisome symptoms of chest tightness, definitive cardiac catheterization was recommended.  DESCRIPTION OF PROCEDURE:  After premedication with Versed, initially 2 mg with 50 mcg of fentanyl, the patient was prepped and draped in usual fashion.  Right femoral artery was punctured anteriorly and a 5-French sheath was inserted without difficulty.  Diagnostic cardiac catheterization was done utilizing 5-French Judkins 4 left and right coronary catheters.  A 200 mcg of IC nitroglycerin was also administered down the left coronary system.  A 5-French pigtail catheter was used for RAO ventriculography.  Hemostasis was obtained by direct manual pressure.  The patient tolerated the procedure well and returned to the room in satisfactory condition.  HEMODYNAMIC DATA:  Central aortic pressure is 107/72, left ventricular pressure 107/14.  ANGIOGRAPHIC DATA:  Left main coronary artery was angiographically normal vessel which bifurcated into an LAD and left circumflex system.  The LAD was a  moderate-sized vessel which essentially had a twin LAD distribution, giving rise to a proximal diagonal vessel which extended towards the apex, as well as the LAD itself extending towards the apex. A very distal branch of the diagonal vessel was very small caliber.  IC nitroglycerin was administered in the event there was spasm here, but no fixed obstruction was demonstrated and the vessel was small, but did improve with nitroglycerin administration.  The LAD itself was free of significant disease.  The circumflex vessel was free of significant disease and gave rise to a bifurcating diagonal vessel that was normal.  The right coronary artery was a large caliber dominant vessel.  The vessel supplied a moderate-sized acute marginal PDA like branch followed by a second PDA branch and then a distal PLA vessel.  The very distal portion of the PLA seem to be a twig like vessel which had slight collateralization from the distal aspect of the second PD vessel to this region.  Again, this was a twig like area and very small caliber, but this most distal aspect of the branch of the PLA was being filled by the PD2 vessel.  RAO ventriculography revealed normal LV function without wall motion abnormalities.  IMPRESSION: 1. Normal left ventricular function. 2. No significant coronary obstructive disease with essentially normal     twin left anterior descending  coronary artery diagonal system;     normal left circumflex system, and large dominant right coronary     artery.  The very distal diminutive portion of a branch of the     distal posterolateral artery seemed to be collateralized from the     most distal portion of the PD2 branch.  RECOMMENDATIONS:  Medical therapy.          ______________________________ Nicki Guadalajara, M.D.     TK/MEDQ  D:  09/22/2012  T:  09/23/2012  Job:  [id number]  cc:   Eduard Clos, MD

## 2013-08-31 ENCOUNTER — Emergency Department (HOSPITAL_COMMUNITY)
Admission: EM | Admit: 2013-08-31 | Discharge: 2013-08-31 | Disposition: A | Discharge status: Other Acute Inpatient Hospital | Payer: 59 | Attending: Emergency Medicine | Admitting: Emergency Medicine

## 2013-08-31 ENCOUNTER — Encounter (HOSPITAL_COMMUNITY): Payer: Self-pay | Admitting: Emergency Medicine

## 2013-08-31 ENCOUNTER — Inpatient Hospital Stay (HOSPITAL_COMMUNITY)
Admission: AD | Admit: 2013-08-31 | Discharge: 2013-09-05 | DRG: 885 | Disposition: A | Discharge status: Home / Self Care | Payer: 59 | Source: Intra-hospital | Attending: Psychiatry | Admitting: Psychiatry

## 2013-08-31 DIAGNOSIS — M129 Arthropathy, unspecified: Secondary | ICD-10-CM | POA: Insufficient documentation

## 2013-08-31 DIAGNOSIS — Z720 Tobacco use: Secondary | ICD-10-CM | POA: Diagnosis present

## 2013-08-31 DIAGNOSIS — R079 Chest pain, unspecified: Secondary | ICD-10-CM

## 2013-08-31 DIAGNOSIS — F332 Major depressive disorder, recurrent severe without psychotic features: Secondary | ICD-10-CM

## 2013-08-31 DIAGNOSIS — F17200 Nicotine dependence, unspecified, uncomplicated: Secondary | ICD-10-CM | POA: Insufficient documentation

## 2013-08-31 DIAGNOSIS — F3281 Premenstrual dysphoric disorder: Secondary | ICD-10-CM | POA: Diagnosis present

## 2013-08-31 DIAGNOSIS — F1210 Cannabis abuse, uncomplicated: Secondary | ICD-10-CM | POA: Diagnosis present

## 2013-08-31 DIAGNOSIS — T1491XA Suicide attempt, initial encounter: Secondary | ICD-10-CM | POA: Diagnosis present

## 2013-08-31 DIAGNOSIS — Z0289 Encounter for other administrative examinations: Secondary | ICD-10-CM | POA: Insufficient documentation

## 2013-08-31 DIAGNOSIS — Z8739 Personal history of other diseases of the musculoskeletal system and connective tissue: Secondary | ICD-10-CM

## 2013-08-31 DIAGNOSIS — R45851 Suicidal ideations: Secondary | ICD-10-CM | POA: Insufficient documentation

## 2013-08-31 DIAGNOSIS — Z8249 Family history of ischemic heart disease and other diseases of the circulatory system: Secondary | ICD-10-CM

## 2013-08-31 DIAGNOSIS — Z79899 Other long term (current) drug therapy: Secondary | ICD-10-CM | POA: Insufficient documentation

## 2013-08-31 DIAGNOSIS — IMO0001 Reserved for inherently not codable concepts without codable children: Secondary | ICD-10-CM | POA: Diagnosis present

## 2013-08-31 DIAGNOSIS — F329 Major depressive disorder, single episode, unspecified: Secondary | ICD-10-CM | POA: Insufficient documentation

## 2013-08-31 DIAGNOSIS — F3289 Other specified depressive episodes: Secondary | ICD-10-CM | POA: Insufficient documentation

## 2013-08-31 DIAGNOSIS — Z3202 Encounter for pregnancy test, result negative: Secondary | ICD-10-CM | POA: Insufficient documentation

## 2013-08-31 DIAGNOSIS — F411 Generalized anxiety disorder: Secondary | ICD-10-CM | POA: Diagnosis present

## 2013-08-31 DIAGNOSIS — F32A Depression, unspecified: Secondary | ICD-10-CM

## 2013-08-31 DIAGNOSIS — F333 Major depressive disorder, recurrent, severe with psychotic symptoms: Secondary | ICD-10-CM | POA: Diagnosis present

## 2013-08-31 DIAGNOSIS — N943 Premenstrual tension syndrome: Secondary | ICD-10-CM | POA: Diagnosis present

## 2013-08-31 HISTORY — DX: Major depressive disorder, single episode, unspecified: F32.9

## 2013-08-31 HISTORY — DX: Depression, unspecified: F32.A

## 2013-08-31 LAB — COMPREHENSIVE METABOLIC PANEL
ALT: 22 U/L (ref 0–35)
Alkaline Phosphatase: 75 U/L (ref 39–117)
BUN: 11 mg/dL (ref 6–23)
CO2: 30 mEq/L (ref 19–32)
Chloride: 103 mEq/L (ref 96–112)
GFR calc Af Amer: 74 mL/min — ABNORMAL LOW (ref >90)
GFR calc non Af Amer: 64 mL/min — ABNORMAL LOW (ref >90)
Glucose, Bld: 79 mg/dL (ref 70–99)
Potassium: 3.8 mEq/L (ref 3.5–5.1)
Sodium: 141 mEq/L (ref 135–145)
Total Bilirubin: 0.3 mg/dL (ref 0.3–1.2)

## 2013-08-31 LAB — ETHANOL: Alcohol, Ethyl (B): <11 mg/dL (ref 0–11)

## 2013-08-31 LAB — CBC
HCT: 40.3 % (ref 36.0–46.0)
Hemoglobin: 13.5 g/dL (ref 12.0–15.0)
RBC: 4.62 MIL/uL (ref 3.87–5.11)

## 2013-08-31 LAB — RAPID URINE DRUG SCREEN, HOSP PERFORMED
Barbiturates: NOT DETECTED
Cocaine: NOT DETECTED
Tetrahydrocannabinol: POSITIVE — AB

## 2013-08-31 LAB — ACETAMINOPHEN LEVEL: Acetaminophen (Tylenol), Serum: <15 ug/mL (ref 10–30)

## 2013-08-31 LAB — POCT PREGNANCY, URINE: Preg Test, Ur: NEGATIVE

## 2013-08-31 MED ORDER — CLONAZEPAM 1 MG PO TABS
1.0000 mg | ORAL_TABLET | Freq: Two times a day (BID) | ORAL | Status: DC | PRN
  Administered 2013-09-01 (×2): 1 mg via ORAL
  Filled 2013-08-31 (×2): qty 1

## 2013-08-31 MED ORDER — MAGNESIUM HYDROXIDE 400 MG/5ML PO SUSP: 30.0000 mL | Freq: Every day | ORAL | Status: DC | PRN

## 2013-08-31 MED ORDER — METOPROLOL TARTRATE 25 MG PO TABS
25.0000 mg | ORAL_TABLET | Freq: Once | ORAL | Status: DC
  Administered 2013-08-31: 25 mg via ORAL
  Filled 2013-08-31 (×2): qty 1

## 2013-08-31 MED ORDER — TEMAZEPAM 15 MG PO CAPS: 30.0000 mg | ORAL_CAPSULE | Freq: Every evening | ORAL | Status: DC | PRN

## 2013-08-31 MED ORDER — METOPROLOL TARTRATE 25 MG PO TABS
25.0000 mg | ORAL_TABLET | Freq: Once | ORAL | Status: DC
  Filled 2013-08-31: qty 1

## 2013-08-31 MED ORDER — FLUOXETINE HCL 20 MG PO CAPS
20.0000 mg | ORAL_CAPSULE | Freq: Every day | ORAL | Status: DC
  Administered 2013-09-01 – 2013-09-05 (×5): 20 mg via ORAL
  Filled 2013-08-31 (×7): qty 1

## 2013-08-31 MED ORDER — METOPROLOL TARTRATE 25 MG/10 ML ORAL SUSPENSION
25.0000 mg | Freq: Two times a day (BID) | ORAL | Status: DC
  Filled 2013-08-31 (×2): qty 10

## 2013-08-31 MED ORDER — CLONAZEPAM 1 MG PO TABS
1.0000 mg | ORAL_TABLET | Freq: Two times a day (BID) | ORAL | Status: DC | PRN
  Administered 2013-08-31: 1 mg via ORAL
  Filled 2013-08-31: qty 1

## 2013-08-31 MED ORDER — CLONIDINE HCL 0.1 MG PO TABS
0.2000 mg | ORAL_TABLET | Freq: Once | ORAL | Status: AC
  Administered 2013-08-31: 0.2 mg via ORAL
  Filled 2013-08-31: qty 2

## 2013-08-31 MED ORDER — PREGABALIN 75 MG PO CAPS
75.0000 mg | ORAL_CAPSULE | Freq: Two times a day (BID) | ORAL | Status: DC
  Administered 2013-09-01 – 2013-09-05 (×9): 75 mg via ORAL
  Filled 2013-08-31 (×9): qty 1

## 2013-08-31 MED ORDER — ALUM & MAG HYDROXIDE-SIMETH 200-200-20 MG/5ML PO SUSP: 30.0000 mL | ORAL | Status: DC | PRN

## 2013-08-31 MED ORDER — TEMAZEPAM 15 MG PO CAPS
30.0000 mg | ORAL_CAPSULE | Freq: Every evening | ORAL | Status: DC | PRN
  Administered 2013-08-31 – 2013-09-03 (×4): 30 mg via ORAL
  Filled 2013-08-31 (×4): qty 2

## 2013-08-31 MED ORDER — FLUOXETINE HCL 20 MG PO CAPS
20.0000 mg | ORAL_CAPSULE | Freq: Every day | ORAL | Status: DC
  Administered 2013-08-31: 20 mg via ORAL
  Filled 2013-08-31: qty 1

## 2013-08-31 MED ORDER — PREGABALIN 50 MG PO CAPS
75.0000 mg | ORAL_CAPSULE | Freq: Two times a day (BID) | ORAL | Status: DC
  Administered 2013-08-31 (×2): 75 mg via ORAL
  Filled 2013-08-31 (×4): qty 1

## 2013-08-31 NOTE — ED Notes (Addendum)
Pt has one bag of pt belongings with clothing, cell phone, and keys in locker 29. Pt offered cell phone and keys to be locked with security; pt declined; placed in pt belongings bag in locker 29. Witnessed by A. Adah Salvage.

## 2013-08-31 NOTE — Consult Note (Signed)
Note reviewed and agreed with  

## 2013-08-31 NOTE — ED Notes (Signed)
No acute distress noted.

## 2013-08-31 NOTE — ED Notes (Signed)
Patient informed that her BP would be checked in 30 min. Patient verbalized understanding. No acute distress noted.

## 2013-08-31 NOTE — ED Notes (Signed)
Pt belongings given to nurse on behavioral holding secured unit.

## 2013-08-31 NOTE — ED Notes (Signed)
MD at bedside. 

## 2013-08-31 NOTE — ED Notes (Signed)
Dr. Blinda Leatherwood gave order to give patient Metoprolol tartrate 25 mg oral. Order read back and verified.

## 2013-08-31 NOTE — ED Notes (Signed)
Per [facility]- Pt reports off meds x 3 weeks sent to [facility] by her MD for treatment of depression and was taken off meds. Thoughts of depression x 1week.  Attempt of SI ( cut left wrist mult. superficial lac to wrist) this am) bleeding controlled. Denies HI

## 2013-08-31 NOTE — ED Notes (Signed)
Pt refused anti-hypertensive claiming she doesn't have hypertension despite the objective data provided by the [facility]. NP aware of refusal

## 2013-08-31 NOTE — Consult Note (Signed)
Bonnie Douglas Face-to-Face Psychiatry Consult   Reason for Consult:  Major Depression and suicide attempt Referring Physician:  EDP  Bonnie Douglas is an 47 y.o. female.  Assessment: AXIS I:  Major Depression, Recurrent severe AXIS II:  Deferred AXIS III:   Past Medical History  Diagnosis Date  . Fibromyalgia   . Arthritis   . Tobacco abuse 09/22/2012  . Depression    AXIS IV:  other psychosocial or environmental problems and problems with primary support group AXIS V:  11-20 some danger of hurting self or others possible OR occasionally fails to maintain minimal personal hygiene OR gross impairment in communication  Plan:  Recommend psychiatric Inpatient admission when medically cleared.  Subjective:   Bonnie Douglas is a 47 y.o. female patient admitted with Major Depressive Disorder with psychosis.  HPI:  Patient states "I am depressed and I don't want to live.  I tried to cut myself and hang myself.  I have a lot going on in my life."  Patient states that he biggest stressor is her son who moved in with her after being released from prison.  States that he has been unappreciative and verbal abusive towards her.  "He acts like he doesn't care.  I was there for him when he was arrested I was there spent 37,000 dollars in attorney fees.  It was his first time getting into trouble.  A person was murdered but he wasn't the one who murdered the other person just at the wrong place at the wrong time.  He had to do 10 years in prison. I sent him money every week and let him move in when he was released.  But, I can't take it anymore the verbal abuse so I put him out 3 weeks ago and it really hurts.  I felt bad and guilt for having to put him out.  I've been to see my doctor (primary care physician) and he stopped my medications and the depression just got worse.  He feels that it may be hormonal and said that I needed to see a psychiatrist for medication management.  I was taking Klonopin and another  medicine that starts with Vet but I can't remember the name of it.    Past Psychiatric History: Past Medical History  Diagnosis Date  . Fibromyalgia   . Arthritis   . Tobacco abuse 09/22/2012  . Depression     reports that she has been smoking Cigarettes.  She has been smoking about 1.00 pack per day. She does not have any smokeless tobacco history on file. She reports that she does not drink alcohol or use illicit drugs. Family History  Problem Relation Age of Onset  . Heart attack             Allergies:   Allergies  Allergen Reactions  . 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 rate 16, SpO2 98.00%.There is no weight on file to calculate BMI. Results for orders placed during the Douglas encounter of 08/31/13 (from the past 72 hour(s))  ACETAMINOPHEN LEVEL     Status: None   Collection Time    08/31/13  8:19 AM      Result Value Range   Acetaminophen (Tylenol), Serum <15.0  10 - 30 ug/mL   Comment:            THERAPEUTIC CONCENTRATIONS VARY     SIGNIFICANTLY. A RANGE OF 10-30     ug/mL MAY BE AN EFFECTIVE     CONCENTRATION FOR MANY PATIENTS.     HOWEVER, SOME ARE BEST TREATED     AT CONCENTRATIONS OUTSIDE THIS     RANGE.     ACETAMINOPHEN CONCENTRATIONS     >150 ug/mL AT 4 HOURS AFTER      INGESTION AND >50 ug/mL AT 12     HOURS AFTER INGESTION ARE     OFTEN ASSOCIATED WITH TOXIC     REACTIONS.  CBC     Status: Abnormal   Collection Time    08/31/13  8:19 AM      Result Value Range   WBC 5.9  4.0 - 10.5 K/uL   RBC 4.62  3.87 - 5.11 MIL/uL   Hemoglobin 13.5  12.0 - 15.0 g/dL   HCT 16.1  09.6 - 04.5 %   MCV 87.2  78.0 - 100.0 fL   MCH 29.2  26.0 - 34.0 pg   MCHC 33.5  30.0 - 36.0 g/dL   RDW 40.9 (*) 81.1 - 91.4 %   Platelets 167  150 - 400 K/uL  COMPREHENSIVE METABOLIC PANEL     Status: Abnormal   Collection Time    08/31/13  8:19 AM      Result Value Range   Sodium 141  135 - 145 mEq/L   Potassium 3.8  3.5 - 5.1 mEq/L   Chloride 103  96 - 112 mEq/L   CO2 30  19 - 32 mEq/L   Glucose, Bld 79  70 - 99 mg/dL   BUN 11  6 - 23 mg/dL   Creatinine, Ser 7.82  0.50 - 1.10 mg/dL   Calcium 95.6  8.4 - 21.3 mg/dL   Total Protein 7.7  6.0 - 8.3 g/dL   Albumin 3.8  3.5 - 5.2 g/dL   AST 29  0 - 37 U/L   ALT 22  0 - 35 U/L   Alkaline Phosphatase 75  39 - 117 U/L   Total Bilirubin 0.3  0.3 - 1.2 mg/dL   GFR calc non Af Amer 64 (*) >90 mL/min   GFR calc Af Amer 74 (*) >90 mL/min   Comment: (NOTE)     The eGFR has been calculated using the CKD EPI equation.     This calculation has not been validated in all clinical situations.     eGFR's persistently <90 mL/min signify possible Chronic Kidney     Disease.  ETHANOL     Status: None   Collection Time    08/31/13  8:19 AM      Result Value Range   Alcohol, Ethyl (B) <11  0 - 11 mg/dL   Comment:            LOWEST DETECTABLE LIMIT FOR     SERUM ALCOHOL IS 11 mg/dL     FOR MEDICAL PURPOSES ONLY  SALICYLATE LEVEL  Status: Abnormal   Collection Time    08/31/13  8:19 AM      Result Value Range   Salicylate Lvl <2.0 (*) 2.8 - 20.0 mg/dL  URINE RAPID DRUG SCREEN (HOSP PERFORMED)     Status: Abnormal   Collection Time    08/31/13  9:25 AM      Result Value Range   Opiates POSITIVE (*) NONE DETECTED   Cocaine NONE  DETECTED  NONE DETECTED   Benzodiazepines NONE DETECTED  NONE DETECTED   Amphetamines NONE DETECTED  NONE DETECTED   Tetrahydrocannabinol POSITIVE (*) NONE DETECTED   Barbiturates NONE DETECTED  NONE DETECTED   Comment:            DRUG SCREEN FOR MEDICAL PURPOSES     ONLY.  IF CONFIRMATION IS NEEDED     FOR ANY PURPOSE, NOTIFY LAB     WITHIN 5 DAYS.                LOWEST DETECTABLE LIMITS     FOR URINE DRUG SCREEN     Drug Class       Cutoff (ng/mL)     Amphetamine      1000     Barbiturate      200     Benzodiazepine   200     Tricyclics       300     Opiates          300     Cocaine          300     THC              50  POCT PREGNANCY, URINE     Status: None   Collection Time    08/31/13  9:48 AM      Result Value Range   Preg Test, Ur NEGATIVE  NEGATIVE   Comment:            THE SENSITIVITY OF THIS     METHODOLOGY IS >24 mIU/mL     Current Facility-Administered Medications  Medication Dose Route Frequency Provider Last Rate Last Dose  . pregabalin (LYRICA) capsule 75 mg  75 mg Oral BID Benjaman Pott, MD   75 mg at 08/31/13 1036   Current Outpatient Prescriptions  Medication Sig Dispense Refill  . clonazePAM (KLONOPIN) 1 MG tablet Take 1 mg by mouth 2 (two) times daily as needed for anxiety. For anxiety      . HYDROcodone-acetaminophen (NORCO) 7.5-325 MG per tablet Take 1 tablet by mouth every 6 (six) hours as needed for moderate pain.      . pregabalin (LYRICA) 100 MG capsule Take 100 mg by mouth 2 (two) times daily.      . temazepam (RESTORIL) 30 MG capsule Take 30 mg by mouth at bedtime as needed for sleep.        Psychiatric Specialty Exam:     Blood pressure 159/100, pulse 80, temperature 98.6 F (37 C), temperature source Oral, resp. rate 16, SpO2 98.00%.There is no weight on file to calculate BMI.  General Appearance: Casual  Eye Contact::  Good  Speech:  Clear and Coherent and Normal Rate  Volume:  Normal  Mood:  Anxious, Depressed and Hopeless   Affect:  Blunt, Depressed, Flat and Tearful  Thought Process:  Circumstantial and Goal Directed  Orientation:  Full (Time, Place, and Person)  Thought Content:  Hallucinations: Auditory Visual and Rumination  Suicidal Thoughts:  Yes.  with intent/plan  Homicidal Thoughts:  No  Memory:  Immediate;   Good Recent;   Good Remote;   Good  Judgement:  Impaired  Insight:  Present  Psychomotor Activity:  Normal  Concentration:  Fair  Recall:  Good  Akathisia:  No  Handed:  Right  AIMS (if indicated):     Assets:  Communication Skills Desire for Improvement Housing Social Support Transportation  Sleep:      Face to face interview and consult with Dr. Ladona Ridgel  Treatment Plan Summary: Daily contact with patient to assess and evaluate symptoms and progress in treatment Medication management  Disposition:  Inpatient treatment recommended.  Monitor for safety and stabilization until inpatient bed is found.  Start home medications and   Rankin, Shuvon 08/31/2013 12:02 PM

## 2013-08-31 NOTE — ED Notes (Signed)
Patient BP 162/107 P 65 T 97.8, 18, 100% on RA.

## 2013-08-31 NOTE — ED Provider Notes (Signed)
CSN: [id number]     Arrival date & time 08/31/13  7829 History   First MD Initiated Contact with Patient 08/31/13 [phone]     Chief Complaint  Patient presents with  . Medical Clearance  . Depression   (Consider location/radiation/quality/duration/timing/severity/associated sxs/prior Treatment) Patient is a 47 y.o. female presenting with mental health disorder.  Mental Health Problem Presenting symptoms: depression, suicidal thoughts and suicide attempt   Patient accompanied by:  Law enforcement Degree of incapacity (severity):  Severe Onset quality:  Gradual Timing:  Constant Progression:  Worsening Chronicity:  New Context: recent medication change   Context: not alcohol use   Relieved by:  Nothing Worsened by:  Lack of sleep Ineffective treatments:  None tried Associated symptoms: anhedonia and feelings of worthlessness   Associated symptoms: no abdominal pain and no chest pain     Past Medical History  Diagnosis Date  . Fibromyalgia   . Arthritis   . Tobacco abuse 09/22/2012  . Depression    Past Surgical History  Procedure Laterality Date  . Uterine ablation     Family History  Problem Relation Age of Onset  . Heart attack     History  Substance Use Topics  . Smoking status: Current Every Day Smoker -- 1.00 packs/day    Types: Cigarettes  . Smokeless tobacco: Not on file  . Alcohol Use: No   OB History   Grav Para Term Preterm Abortions TAB SAB Ect Mult Living                 Review of Systems  Constitutional: Negative for fever.  HENT: Negative for congestion.   Respiratory: Negative for cough and shortness of breath.   Cardiovascular: Negative for chest pain.  Gastrointestinal: Negative for nausea, vomiting, abdominal pain and diarrhea.  Psychiatric/Behavioral: Positive for suicidal ideas.  All other systems reviewed and are negative.    Allergies  Vicodin  Home Medications   Current Outpatient Rx  Name  Route  Sig  Dispense  Refill  .  clonazePAM (KLONOPIN) 1 MG tablet   Oral   Take 1 mg by mouth 2 (two) times daily as needed for anxiety. For anxiety         . HYDROcodone-acetaminophen (LORTAB) 7.5-500 MG per tablet   Oral   Take 1 tablet by mouth every 6 (six) hours as needed for pain.         . pregabalin (LYRICA) 75 MG capsule   Oral   Take 75 mg by mouth 2 (two) times daily.         . metoprolol tartrate (LOPRESSOR) 25 MG tablet   Oral   Take 12.5 mg by mouth 2 (two) times daily.          BP 159/100  Pulse 80  Temp(Src) 98.6 F (37 C) (Oral)  Resp 16  SpO2 98% Physical Exam  Nursing note and vitals reviewed. Constitutional: She is oriented to person, place, and time. She appears well-developed and well-nourished. No distress.  HENT:  Head: Normocephalic and atraumatic.  Eyes: Conjunctivae are normal. No scleral icterus.  Neck: Neck supple.  Cardiovascular: Normal rate and intact distal pulses.   Pulmonary/Chest: Effort normal. No stridor. No respiratory distress.  Abdominal: Normal appearance. She exhibits no distension.  Neurological: She is alert and oriented to person, place, and time.  Skin: Skin is warm and dry. No rash noted.  Multiple superficial lacerations to left wrist, none deep, none bleeding.  Psychiatric: She is withdrawn. She exhibits a  depressed mood. She expresses suicidal ideation. She expresses suicidal plans.  Crying    ED Course  Procedures (including critical care time) Labs Review Labs Reviewed  CBC - Abnormal; Notable for the following:    RDW 15.7 (*)    All other components within normal limits  COMPREHENSIVE METABOLIC PANEL - Abnormal; Notable for the following:    GFR calc non Af Amer 64 (*)    GFR calc Af Amer 74 (*)    All other components within normal limits  SALICYLATE LEVEL - Abnormal; Notable for the following:    Salicylate Lvl <2.0 (*)    All other components within normal limits  URINE RAPID DRUG SCREEN (HOSP PERFORMED) - Abnormal; Notable for  the following:    Opiates POSITIVE (*)    Tetrahydrocannabinol POSITIVE (*)    All other components within normal limits  ACETAMINOPHEN LEVEL  ETHANOL  POCT PREGNANCY, URINE   Imaging Review No results found.  EKG Interpretation   None       MDM   1. Suicidal ideation   2. Depression    48 yo female presenting with depression, SI, and reported suicide attempt by cutting wrist.  None of her lacerations require repair.  No signs of symptoms of organic disease.  Plan IVC, TTS consult, psych hold.     Candyce Churn, MD 08/31/13 [phone]

## 2013-08-31 NOTE — [facility] Counselor (Signed)
Pt has been accepted to bed 500-1 per [facility]. Writer notified Shuvon Rankin NP. Support paperwork will need to be completed by oncoming TTS.  Evette Cristal, [place] Assessment Counselor

## 2013-08-31 NOTE — BH Assessment (Signed)
Consulted with EDP Dr. Blinda Leatherwood and informed him that patient has been accepted to [facility] to bed 500-1 by Assunta Found, NP assigned to Dr. Elsie Saas for treatment. Patient signed all appropriate support documentation to include Voluntary Consent and ROI. Admission Checklist started by TTS. Support documentation faxed to [facility] for review. Glorious Peach, MS, LCASA Assessment Counselor

## 2013-08-31 NOTE — ED Notes (Signed)
Patient wallet.cellphone,and keys was given to her daughter

## 2013-09-01 ENCOUNTER — Encounter (HOSPITAL_COMMUNITY): Payer: Self-pay | Admitting: Behavioral Health

## 2013-09-01 DIAGNOSIS — T1491XA Suicide attempt, initial encounter: Secondary | ICD-10-CM | POA: Diagnosis present

## 2013-09-01 DIAGNOSIS — F332 Major depressive disorder, recurrent severe without psychotic features: Secondary | ICD-10-CM

## 2013-09-01 DIAGNOSIS — F3281 Premenstrual dysphoric disorder: Secondary | ICD-10-CM | POA: Diagnosis present

## 2013-09-01 DIAGNOSIS — F1994 Other psychoactive substance use, unspecified with psychoactive substance-induced mood disorder: Secondary | ICD-10-CM

## 2013-09-01 DIAGNOSIS — F1210 Cannabis abuse, uncomplicated: Secondary | ICD-10-CM

## 2013-09-01 MED ORDER — NAPROXEN 500 MG PO TABS
500.0000 mg | ORAL_TABLET | Freq: Two times a day (BID) | ORAL | Status: DC
  Administered 2013-09-01 – 2013-09-05 (×8): 500 mg via ORAL
  Filled 2013-09-01 (×15): qty 1

## 2013-09-01 MED ORDER — ARIPIPRAZOLE 5 MG PO TABS
5.0000 mg | ORAL_TABLET | Freq: Two times a day (BID) | ORAL | Status: DC
  Administered 2013-09-01 – 2013-09-05 (×9): 5 mg via ORAL
  Filled 2013-09-01 (×13): qty 1

## 2013-09-01 MED ORDER — ENSURE COMPLETE PO LIQD
237.0000 mL | Freq: Three times a day (TID) | ORAL | Status: DC
  Administered 2013-09-01 – 2013-09-04 (×10): 237 mL via ORAL

## 2013-09-01 MED ORDER — CYCLOBENZAPRINE HCL 10 MG PO TABS
10.0000 mg | ORAL_TABLET | Freq: Three times a day (TID) | ORAL | Status: DC | PRN
  Administered 2013-09-01 – 2013-09-04 (×5): 10 mg via ORAL
  Filled 2013-09-01 (×6): qty 1

## 2013-09-01 MED ORDER — BACITRACIN-NEOMYCIN-POLYMYXIN 400-5-5000 EX OINT
TOPICAL_OINTMENT | CUTANEOUS | Status: AC
  Administered 2013-09-01: 23:00:00
  Filled 2013-09-01: qty 1
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
## 2013-09-01 NOTE — [facility] Suicide Risk Assessment (Signed)
Suicide Risk Assessment  Admission Assessment     Nursing information obtained from:  Patient Demographic factors:  NA Current Mental Status:  Self-harm thoughts Loss Factors:  Financial problems / change in socioeconomic status Historical Factors:  NA Risk Reduction Factors:  Sense of responsibility to family;Living with another person, especially a relative  CLINICAL FACTORS:   Severe Anxiety and/or Agitation Depression:   Aggression Anhedonia Hopelessness Impulsivity Insomnia Recent sense of peace/wellbeing Severe Alcohol/Substance Abuse/Dependencies Chronic Pain Unstable or Poor Therapeutic Relationship Previous Psychiatric Diagnoses and Treatments Medical Diagnoses and Treatments/Surgeries  COGNITIVE FEATURES THAT CONTRIBUTE TO RISK:  Closed-mindedness Loss of executive function Polarized thinking Thought constriction (tunnel vision)    SUICIDE RISK:   Severe:  Frequent, intense, and enduring suicidal ideation, specific plan, no subjective intent, but some objective markers of intent (i.e., choice of lethal method), the method is accessible, some limited preparatory behavior, evidence of impaired self-control, severe dysphoria/symptomatology, multiple risk factors present, and few if any protective factors, particularly a lack of social support.  PLAN OF CARE: Admit involuntarily and emergently from [facility] long emergency department for severe symptoms of depression and suicidal attempt by cutting her left wrist. Patient needs crisis management, safety monitoring, case management and medication management  I certify that inpatient services furnished can reasonably be expected to improve the patient's condition.   Nehemiah Settle., M.D. 09/01/2013, 12:11 PM

## 2013-09-01 NOTE — [facility] Group Notes (Signed)
[facility] LCSW Group Therapy  [facility] 1:15 - 2:30 PM  09/01/2013   Type of Therapy:  Group Therapy  Participation Level: Did not attend group.   Bonnie Douglas 09/01/2013     

## 2013-09-01 NOTE — Tx Team (Signed)
Initial Interdisciplinary Treatment Plan  PATIENT STRENGTHS: (choose at least two) Average or above average intelligence General fund of knowledge Work skills  PATIENT STRESSORS: Financial difficulties Marital or family conflict Medication change or noncompliance   PROBLEM LIST: Problem List/Patient Goals Date to be addressed Date deferred Reason deferred Estimated date of resolution  Depression 09/01/13     Suicidal Ideation 09/01/13                                                DISCHARGE CRITERIA:  Ability to meet basic life and health needs Improved stabilization in mood, thinking, and/or behavior Motivation to continue treatment in a less acute level of care Verbal commitment to aftercare and medication compliance  PRELIMINARY DISCHARGE PLAN: Outpatient therapy Return to previous living arrangement  PATIENT/FAMIILY INVOLVEMENT: This treatment plan has been presented to and reviewed with the patient, Bonnie Douglas, and/or family member.  The patient and family have been given the opportunity to ask questions and make suggestions.  Leda Quail T 09/01/2013, 12:00 AM
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
## 2013-09-01 NOTE — Progress Notes (Signed)
D: Patient presented at the medication window with flat and blunted affect and irritable, depressed mood. She reported on the self inventory sheet that she has poor sleep and ability to pay attention, low energy level and improving appetite. Patient rated depression and feelings of hopelessness "10". She's not attending groups at this time. Patient voiced that she's not wanting to have a roommate because she feels like she may do something to the roommate. Writer spoke with Press photographer regarding this comment made by the patient; per charge RN, the patient is to stay in the quiet room for the time being and once a room is available she will be moved there; however it was explained to the patient that we do not have personal rooms, and it is possible that she will still have a roommate at some point during her hospital stay.  A: Support and encouragement provided to patient. Scheduled medications administered per MD orders. Maintain Q15 minute checks for safety.  R: Patient receptive. Passive SI, but contracts for safety. Denies HI and auditory/visual hallucinations. Patient remains safe.

## 2013-09-01 NOTE — Progress Notes (Signed)
Adult Psychoeducational Group Note  Date:  09/01/2013 Time:  11:00am Group Topic/Focus:  Rediscovering Joy:   The focus of this group is to explore various ways to relieve stress in a positive manner.  Participation Level:  Did Not Attend  Participation Quality:    Affect:    Cognitive:    Insight:  Engagement in Group:   Modes of Intervention:    Additional Comments: Pt did not attend group.  Shelly Bombard D 09/01/2013, 12:54 PM

## 2013-09-01 NOTE — Progress Notes (Signed)
Nutrition Brief Note  Patient identified on the Malnutrition Screening Tool (MST) Report.  Wt Readings from Last 10 Encounters:  08/31/13 150 lb (68.04 kg)  09/22/12 156 lb (70.761 kg)  09/22/12 156 lb (70.761 kg)   Body mass index is 22.14 kg/(m^2). Patient meets criteria for normal based on current BMI.   Discussed intake PTA with patient and compared to intake presently.  Discussed changes in intake, if any, and encouraged adequate intake of meals and snacks. Admitted with depression. Reports 10-12 pound unintended weight loss in the past 2 months r/t lack of appetite. States she was living off of Ensure, was drinking 4 per day. States she's been eating at least 50% of her meals during admission.    Current diet order is regular and pt is also offered choice of unit snacks mid-morning and mid-afternoon.  Pt is eating as desired.   Labs and medications reviewed.   Nutrition Dx:  Unintended wt change r/t suboptimal oral intake AEB pt report  Interventions:   Discussed the importance of nutrition and encouraged intake of food and beverages.     Supplements: Ensure Complete TID  No additional nutrition interventions warranted at this time. If nutrition issues arise, please consult RD.   Levon Hedger MS, RD, LDN [phone] Pager [phone] After Hours Pager
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
## 2013-09-01 NOTE — Progress Notes (Signed)
Morning Wellness Group  9:00 AM  The focus of this group is to educate the patient on the purpose and policies of crisis stabilization and provide a format to answer questions about their admission.  The group details unit policies and expectations of patients while admitted.  Patient did not attend; she was resting in the quiet room.

## 2013-09-01 NOTE — [facility] Suicide Risk Assessment (Signed)
[facility] INPATIENT:  Family/Significant Other Suicide Prevention Education  Suicide Prevention Education:  Education Completed; Bonnie Douglas - daughter [phone]),  (name of family member/significant other) has been identified by the patient as the family member/significant other with whom the patient will be residing, and identified as the person(s) who will aid the patient in the event of a mental health crisis (suicidal ideations/suicide attempt).  With written consent from the patient, the family member/significant other has been provided the following suicide prevention education, prior to the and/or following the discharge of the patient.  The suicide prevention education provided includes the following:  Suicide risk factors  Suicide prevention and interventions  National Suicide Hotline telephone number  [facility] assessment telephone number  [facility] Emergency Assistance 911  [facility] and/or Residential Mobile Crisis Unit telephone number  Request made of family/significant other to:  Remove weapons (e.g., guns, rifles, knives), all items previously/currently identified as safety concern.    Remove drugs/medications (over-the-counter, prescriptions, illicit drugs), all items previously/currently identified as a safety concern.  The family member/significant other verbalizes understanding of the suicide prevention education information provided.  The family member/significant other agrees to remove the items of safety concern listed above.  Bonnie Douglas 09/01/2013, 2:43 PM

## 2013-09-02 MED ORDER — NICOTINE 21 MG/24HR TD PT24
21.0000 mg | MEDICATED_PATCH | Freq: Every day | TRANSDERMAL | Status: DC
  Administered 2013-09-02 – 2013-09-04 (×3): 21 mg via TRANSDERMAL
  Filled 2013-09-02 (×5): qty 1

## 2013-09-02 MED ORDER — HYDROXYZINE HCL 25 MG PO TABS
25.0000 mg | ORAL_TABLET | Freq: Three times a day (TID) | ORAL | Status: DC | PRN
  Administered 2013-09-02 – 2013-09-04 (×6): 25 mg via ORAL
  Filled 2013-09-02 (×7): qty 1

## 2013-09-02 NOTE — Progress Notes (Signed)
D   Pt is depressed and anxious   She requested several extra medications at bedtime and is somaticly focused   Pt has mild superficial scratches on her left arm and wanted them covered before her daughter visits tomorrow   A   Verbal support given  Medications given and monitored for theraputic effect   Q 15 min checks R   Pt safe at present

## 2013-09-02 NOTE — Progress Notes (Signed)
D: Patient denies SI/HI and A/V hallucinations; patient reports sleep is fair and she requested medication; reports appetite is improving ; reports energy level is normal ; reports ability to pay attention is improving; rates depression as 5/10; rates hopelessness 5/10; patient reports that she wants to go home and reports that she feels that her hormones are not in balance and that could be leading to her depression  A: Monitored q 15 minutes; patient encouraged to attend groups; patient educated about medications; patient given medications per physician orders; patient encouraged to express feelings and/or concerns  R: Patient is frustrated about the situation and wants to understand; patient is sad and depressed; patient  patient's interaction with staff and peers is appropriate; patient was able to set goal to talk with staff 1:1 when having feelings of SI; patient is taking medications as prescribed and tolerating medications; patient is not attending any groups at this point
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
## 2013-09-02 NOTE — Progress Notes (Signed)
[facility] MD Progress Note  09/02/2013 10:52 AM Bonnie Douglas  MRN:  [id number] Subjective: "I don't know I have to be kept in the hospital in order to get help, I was just depressed, frustrated and I told them in the emergency room that  I was suicidal." Objective: Patient who is requesting to be discharged but still verbalizing depression and irritability. Patient has difficulty getting along with others, she is very aggressive to others, demanding and entitled. She was transferred to 400 hall from 500 hall after she got into arguments with others. She is uncooperative and has not been attending unit activities. Diagnosis:   DSM5: Schizophrenia Disorders:   Obsessive-Compulsive Disorders:   Trauma-Stressor Disorders:   Substance/Addictive Disorders:  Cannabis Use Disorder - Moderate 9304.30) Depressive Disorders:  Disruptive Mood Dysregulation Disorder (296.99) and Major Depressive Disorder - Severe (296.23)  Axis I: Major Depression, Recurrent severe .           Cannabis use disorder moderate.           Substance Induced Mood Disorder  ADL's:  Intact  Sleep: Fair  Appetite:  Fair  Suicidal Ideation: yes  Homicidal Ideation: denies  AEB (as evidenced by):  Psychiatric Specialty Exam: Review of Systems  Constitutional: Negative.   HENT: Negative.   Eyes: Negative.   Respiratory: Negative.   Cardiovascular: Negative.   Gastrointestinal: Negative.   Genitourinary: Negative.   Musculoskeletal: Negative.   Skin: Negative.   Neurological: Negative.   Endo/Heme/Allergies: Negative.   Psychiatric/Behavioral: Positive for depression, suicidal ideas and substance abuse. The patient is nervous/anxious.     Blood pressure 129/87, pulse 66, temperature 97.3 F (36.3 C), temperature source Oral, resp. rate 16, height 5\' 9"  (1.753 m), weight 68.04 kg (150 lb).Body mass index is 22.14 kg/(m^2).  General Appearance: Fairly Groomed  Patent attorney::  Good  Speech:  Clear and Coherent  Volume:   Increased  Mood:  Angry and Irritable  Affect:  Labile  Thought Process:  Linear  Orientation:  Full (Time, Place, and Person)  Thought Content:  Negative  Suicidal Thoughts:  Yes.  without intent/plan  Homicidal Thoughts:  No  Memory:  Immediate;   Fair Recent;   Fair Remote;   Fair  Judgement:  Poor  Insight:  Lacking  Psychomotor Activity:  Increased  Concentration:  Poor  Recall:  Good  Akathisia:  No  Handed:  Right  AIMS (if indicated):     Assets:  Financial Resources/Insurance Physical Health  Sleep:  Number of Hours: 5.5   Current Medications: Current Facility-Administered Medications  Medication Dose Route Frequency Provider Last Rate Last Dose  . alum & mag hydroxide-simeth (MAALOX/MYLANTA) 200-200-20 MG/5ML suspension 30 mL  30 mL Oral Q4H PRN Shuvon Rankin, NP      . ARIPiprazole (ABILIFY) tablet 5 mg  5 mg Oral BID Nehemiah Settle, MD   5 mg at 09/02/13 0813  . cyclobenzaprine (FLEXERIL) tablet 10 mg  10 mg Oral TID PRN Kerry Hough, PA-C   10 mg at 09/01/13 2159  . feeding supplement (ENSURE COMPLETE) (ENSURE COMPLETE) liquid 237 mL  237 mL Oral TID BM Lavena Bullion, RD   237 mL at 09/01/13 2201  . FLUoxetine (PROZAC) capsule 20 mg  20 mg Oral Daily Shuvon Rankin, NP   20 mg at 09/02/13 0813  . hydrOXYzine (ATARAX/VISTARIL) tablet 25 mg  25 mg Oral TID PRN Linnet Bottari      . magnesium hydroxide (MILK OF MAGNESIA) suspension 30  mL  30 mL Oral Daily PRN Shuvon Rankin, NP      . metoprolol tartrate (LOPRESSOR) tablet 25 mg  25 mg Oral Once Shuvon Rankin, NP      . naproxen (NAPROSYN) tablet 500 mg  500 mg Oral BID WC Kerry Hough, PA-C   500 mg at 09/01/13 0758  . pregabalin (LYRICA) capsule 75 mg  75 mg Oral BID Shuvon Rankin, NP   75 mg at 09/02/13 0813  . temazepam (RESTORIL) capsule 30 mg  30 mg Oral QHS PRN Shuvon Rankin, NP   30 mg at 09/01/13 2159    Lab Results: No results found for this or any previous visit (from the past 48  hour(s)).  Physical Findings: AIMS:  , ,  ,  ,    CIWA:  CIWA-Ar Total: 0 COWS:  COWS Total Score: 0  Treatment Plan Summary: Daily contact with patient to assess and evaluate symptoms and progress in treatment Medication management  Plan:1. Admit for crisis management and stabilization. 2. Medication management to reduce current symptoms to base line and improve the     patient's overall level of functioning 3. Treat health problems as indicated. 4. Develop treatment plan to decrease risk of relapse upon discharge and the need for     readmission. 5. Psycho-social education regarding relapse prevention and self care. 6. Health care follow up as needed for medical problems. 7. Restart home medications where appropriate.  Medical Decision Making Problem Points:  Established problem, worsening (2), Review of last therapy session (1) and Review of psycho-social stressors (1) Data Points:  Order Aims Assessment (2) Review of medication regiment & side effects (2) Review of new medications or change in dosage (2)  I certify that inpatient services furnished can reasonably be expected to improve the patient's condition.   Thedore Mins, MD 09/02/2013, 10:52 AM

## 2013-09-02 NOTE — Progress Notes (Signed)
Recreation Therapy Notes  Date: 11.07.2014 Time: 9:30am Location: 400 Hall Dayroom  Group Topic: Leisure Education  Goal Area(s) Addresses:  Patient will verbalize benefit of leisure. Patient will identify positive emotions associated with leisure.  Behavioral Response: Did not attend.   Marykay Lex Kashauna Celmer, LRT/CTRS  Gailen Venne L 09/02/2013 10:11 AM

## 2013-09-02 NOTE — Tx Team (Signed)
  Interdisciplinary Treatment Plan Update   Date Reviewed:  09/02/2013  Time Reviewed:  8:01 AM  Progress in Treatment:   Attending groups: No Participating in groups: No Taking medication as prescribed: Yes  Tolerating medication: Yes Family/Significant other contact made: Yes  Patient understands diagnosis: No  Thinks her problems stem from thd fact that she is menopausal Discussing patient identified problems/goals with staff: Yes  See initial plan Medical problems stabilized or resolved: Yes Denies suicidal/homicidal ideation: Yes  In tx team Patient has not harmed self or others: Yes  For review of initial/current patient goals, please see plan of care.  Estimated Length of Stay:  4-5 days  Reason for Continuation of Hospitalization: Depression Medication stabilization Other; describe Mood lability, irritation, anger  New Problems/Goals identified:  N/A  Discharge Plan or Barriers:   return home, follow up outpt  Additional Comments:  Patient admitted involuntarily and emergently from [facility] emergency department for severe symptoms of depression, anxiety, agitation and suicide attempt. Patient states "I am depressed and I don't want to live. Patient tried to cut myself with a sharp object and try hang myself. Patient stated " I have a lot going on in my life and I cannot cope up with it anymore"   Today in tx team pt is irritable, angry, stating she wants to leave because "I didn't know this is what I would have to go through to get help."  Gave a complicated explanation about why she does not want to take hormone medication, and then asked Dr to prescribe it for her.   Attendees:  Signature: Thedore Mins, MD 09/02/2013 8:01 AM   Signature: Richelle Ito, LCSW 09/02/2013 8:01 AM  Signature: Fransisca Kaufmann, NP 09/02/2013 8:01 AM  Signature: Joslyn Devon, RN 09/02/2013 8:01 AM  Signature: Liborio Nixon, RN 09/02/2013 8:01 AM  Signature:  09/02/2013 8:01 AM  Signature:    09/02/2013 8:01 AM  Signature:    Signature:    Signature:    Signature:    Signature:    Signature:      Scribe for Treatment Team:   Richelle Ito, LCSW  09/02/2013 8:01 AM
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
## 2013-09-03 NOTE — Progress Notes (Signed)
D: pt is very flat and blunted, irritable. Pt. Is not wanting to really have a conversation with Clinical research associate about day. Pt. Is guarded. Pt did have a visitor, daughter, pt. Stated it was nice seeing her. Pt. denies SI/HI/AVH. Pt stated she has been in her lower back and legs, rates it 7/10. A: q 15 min safety checks. scheduled and prn meds given. Support and encouragement offered R: pt remains safe on unit. Interacting appropriately with others in day room

## 2013-09-03 NOTE — [facility] Group Notes (Signed)
[facility] Group Notes:  (Nursing/MHT/Case Management/Adjunct)  Date:  09/03/2013  Time:  10:49 AM  Type of Therapy:  Psychoeducational Skills  Participation Level:  Minimal  Participation Quality:  Inattentive  Affect:  Irritable and Labile  Cognitive:  Appropriate  Insight:  Lacking  Engagement in Group:  Limited  Modes of Intervention:  Discussion, Education and Exploration  Summary of Progress/Problems: healthy coping skills, self inventory review with RN.   Malva Limes 09/03/2013, 10:49 AM

## 2013-09-03 NOTE — Progress Notes (Signed)
       bhhAdult Psychoeducational Group Note  Date:  09/03/2013 Time:  8:48 PM   Group Topic/Focus:  Wrap-Up Group:   The focus of this group is to help patients review their daily goal of treatment and discuss progress on daily workbooks.  Participation Level:  Active  Participation Quality:  Appropriate  Affect:  Appropriate  Cognitive:  Appropriate  Insight: Appropriate  Engagement in Group:  Engaged  Modes of Intervention:  Discussion  Additional CommentsThe  Patient expressed that her went well.The patient also expressed that she was preparing for discharge.  Octavio Manns 09/03/2013, 8:48 PM

## 2013-09-03 NOTE — Progress Notes (Signed)
Patient ID: Bonnie Douglas, female   DOB: Sep 08, 1966, 47 y.o.   MRN: [id number] D. Patient presents with depressed mood , irritable affect. Patient states '' I just want to see about going home, I don't want to hurt myself anymore and I'm ready to go , I've got to get back to working and clean my house. '' Patient reports her mood has improved , rating depression on self inventory at 2/10 on depression scale 10 being worst depression 1 being least. Patient also reports sleeping well. A. Support and encouragement provided. Encouraged pt to discuss discharge concerns with MD. Medications given as ordered. R. Patient remains calm and cooperative at this time. Will continue to monitor q 15 minutes for safety.

## 2013-09-03 NOTE — Progress Notes (Signed)
D: pt. In dayroom interacting with others. Pt stated that today was good. Pt. Not really wanting to relay much to Clinical research associate. Pt. Has flat affect , depressed mood. Pt. Is calm and cooperative. Denies SI/HI/AVH. Pt. Reports having some anxiety and pain. A: q 15 min safety checks. scheduled and prn medication given. Support and encouragement offered R: pt. Remains safe on unit.

## 2013-09-04 NOTE — B[facility] Group Notes (Signed)
Adult Psychoeducational Group Note  Date:  09/04/2013 Time:  9:09 PM  Group Topic/Focus:  Wrap-Up Group:   The focus of this group is to help patients review their daily goal of treatment and discuss progress on daily workbooks.  Participation Level:  Active  Participation Quality:  Appropriate  Affect:  Flat  Cognitive:  Appropriate  Insight: Good  Engagement in Group:  Engaged  Modes of Intervention:  Discussion  Additional Comments:  Bonnie Douglas expressed that she had a good day.  She also explained that she enjoyed the music therapy group.  Her support system is her family.  Caroll Rancher A 09/04/2013, 9:09 PM

## 2013-09-04 NOTE — Progress Notes (Signed)
Patient ID: Bonnie Douglas, female   DOB: 31-Dec-1965, 47 y.o.   MRN: [id number]  [facility] MD Progress Note  09/04/2013 2:32 PM Bonnie Douglas  MRN:  [id number] Subjective:  Patient states "I'm still doing well. I'm just ready to get back to my life. I'm glad that my medications are helping me."  Objective:  Patient observed in the dayroom watching TV. She is somewhat guarded upon approach but pleasant. Notes from nursing reveal that the patient continues to be very irritable at times especially when staff inquire about how she is doing. However, the patient is compliant with her medication regimen and endorses no adverse affects.   Diagnosis:   DSM5: Schizophrenia Disorders:   Obsessive-Compulsive Disorders:   Trauma-Stressor Disorders:   Substance/Addictive Disorders:  Cannabis Use Disorder - Moderate 9304.30) Depressive Disorders:  Disruptive Mood Dysregulation Disorder (296.99) and Major Depressive Disorder - Severe (296.23)  Axis I: Major Depression, Recurrent severe .           Cannabis use disorder moderate.           Substance Induced Mood Disorder  ADL's:  Intact  Sleep: Fair  Appetite:  Fair  Suicidal Ideation: yes  Homicidal Ideation: denies  AEB (as evidenced by):  Psychiatric Specialty Exam: Review of Systems  Constitutional: Negative.   HENT: Negative.   Eyes: Negative.   Respiratory: Negative.   Cardiovascular: Negative.   Gastrointestinal: Negative.   Genitourinary: Negative.   Musculoskeletal: Negative.   Skin: Negative.   Neurological: Negative.   Endo/Heme/Allergies: Negative.   Psychiatric/Behavioral: Positive for depression, suicidal ideas and substance abuse. Negative for hallucinations and memory loss. The patient is nervous/anxious. The patient does not have insomnia.     Blood pressure 129/85, pulse 110, temperature 98.1 F (36.7 C), temperature source Oral, resp. rate 18, height 5\' 9"  (1.753 m), weight 68.04 kg (150 lb).Body mass index is 22.14  kg/(m^2).  General Appearance: Fairly Groomed  Patent attorney::  Good  Speech:  Clear and Coherent  Volume:  Increased  Mood:  Angry and Irritable  Affect:  Labile  Thought Process:  Linear  Orientation:  Full (Time, Place, and Person)  Thought Content:  Negative  Suicidal Thoughts:  Yes.  without intent/plan  Homicidal Thoughts:  No  Memory:  Immediate;   Fair Recent;   Fair Remote;   Fair  Judgement:  Poor  Insight:  Lacking  Psychomotor Activity:  Increased  Concentration:  Poor  Recall:  Good  Akathisia:  No  Handed:  Right  AIMS (if indicated):     Assets:  Financial Resources/Insurance Physical Health  Sleep:  Number of Hours: 6.25   Current Medications: Current Facility-Administered Medications  Medication Dose Route Frequency Provider Last Rate Last Dose  . alum & mag hydroxide-simeth (MAALOX/MYLANTA) 200-200-20 MG/5ML suspension 30 mL  30 mL Oral Q4H PRN Shuvon Rankin, NP      . ARIPiprazole (ABILIFY) tablet 5 mg  5 mg Oral BID Nehemiah Settle, MD   5 mg at 09/04/13 0743  . cyclobenzaprine (FLEXERIL) tablet 10 mg  10 mg Oral TID PRN Kerry Hough, PA-C   10 mg at 09/04/13 0744  . feeding supplement (ENSURE COMPLETE) (ENSURE COMPLETE) liquid 237 mL  237 mL Oral TID BM Lavena Bullion, RD   237 mL at 09/04/13 1401  . FLUoxetine (PROZAC) capsule 20 mg  20 mg Oral Daily Shuvon Rankin, NP   20 mg at 09/04/13 0743  . hydrOXYzine (ATARAX/VISTARIL) tablet 25 mg  25 mg Oral TID PRN Mojeed Akintayo   25 mg at 09/04/13 0743  . magnesium hydroxide (MILK OF MAGNESIA) suspension 30 mL  30 mL Oral Daily PRN Shuvon Rankin, NP      . metoprolol tartrate (LOPRESSOR) tablet 25 mg  25 mg Oral Once Shuvon Rankin, NP      . naproxen (NAPROSYN) tablet 500 mg  500 mg Oral BID WC Kerry Hough, PA-C   500 mg at 09/04/13 0743  . nicotine (NICODERM CQ - dosed in mg/24 hours) patch 21 mg  21 mg Transdermal Daily Mojeed Akintayo   21 mg at 09/04/13 0744  . pregabalin (LYRICA) capsule  75 mg  75 mg Oral BID Shuvon Rankin, NP   75 mg at 09/04/13 0743  . temazepam (RESTORIL) capsule 30 mg  30 mg Oral QHS PRN Shuvon Rankin, NP   30 mg at 09/03/13 2045    Lab Results: No results found for this or any previous visit (from the past 48 hour(s)).  Physical Findings: AIMS:  , ,  ,  ,    CIWA:  CIWA-Ar Total: 0 COWS:  COWS Total Score: 0  Treatment Plan Summary: Daily contact with patient to assess and evaluate symptoms and progress in treatment Medication management  Plan:1. Continue crisis management and stabilization. 2. Medication management to reduce current symptoms to base line and improve the patient's overall level of functioning. Reviewed with patient who stated no untoward effects. Continue Prozac 20 mg for depressive symptoms, Abilify 5 mg twice daily for improved stability of mood.  3. Treat health problems as indicated. 4. Develop treatment plan to decrease risk of relapse upon discharge and the need for readmission. ELOS 1-2 days.  5. Psycho-social education regarding relapse prevention and self care. 6. Health care follow up as needed for medical problems. 7. Restart home medications where appropriate.  Medical Decision Making Problem Points:  Established problem, stable/improving (1) and Review of psycho-social stressors (1) Data Points:  Order Aims Assessment (2) Review of medication regiment & side effects (2) Review of new medications or change in dosage (2)  I certify that inpatient services furnished can reasonably be expected to improve the patient's condition.   Fransisca Kaufmann, NP-C 09/04/2013, 2:32 PM  Reviewed the information documented and agree with the treatment plan.  Jakaya Jacobowitz,JANARDHAHA R. 09/04/2013 3:38 PM

## 2013-09-04 NOTE — Progress Notes (Signed)
Patient ID: Bonnie Douglas, female   DOB: 03/18/1966, 47 y.o.   MRN: [id number] Psychoeducational Group Note  Date:  09/04/2013 Time:  1515pm  Group Topic/Focus:  Making Healthy Choices:   The focus of this group is to help patients identify negative/unhealthy choices they were using prior to admission and identify positive/healthier coping strategies to replace them upon discharge.  Participation Level:  Active  Participation Quality:  Appropriate  Affect:  Appropriate  Cognitive:  Appropriate  Insight:  Supportive  Engagement in Group:  Supportive  Additional Comments:  Psychoeducational group   Valente David 09/04/2013,4:26 PM

## 2013-09-04 NOTE — BHH Group Notes (Signed)
[facility] Group Notes:  (Clinical Social Work)  09/04/2013   11:15am-12:00pm  Summary of Progress/Problems:  The main focus of today's process group was to listen to a variety of genres of music and to identify that different types of music provoke different responses.  The patient then was able to identify personally what was soothing for them, as well as energizing.  Handouts were used to record feelings evoked, as well as how patient can personally use this knowledge in sleep habits, with depression, and with other symptoms.  The patient expressed understanding of concepts, as well as knowledge of how each type of music affected them and how this can be used when they are at home as a tool in their recovery.  Type of Therapy:  Music Therapy   Participation Level:  Active  Participation Quality:  Attentive and Sharing  Affect:  Blunted  Cognitive:  Oriented  Insight:  Engaged  Engagement in Therapy:  Engaged  Modes of Intervention:   Activity, Exploration  Bonnie Miyoshi Grossman-Orr, LCSW 09/04/2013, 12:30pm    

## 2013-09-04 NOTE — Progress Notes (Addendum)
Patient ID: Bonnie Douglas, female   DOB: 19-Jul-1966, 47 y.o.   MRN: [id number] D. Patient presents with depressed mood, affect irritable . Patient states '' I'm feeling ok, I had a little trouble sleeping last night , getting a new room mate in. But I'm doing ok it was good for me to get out of my room yesterday and socialize with the other patients. '' Patient completed self inventory and rates depression at 3/10 on depression scale, 10 being worst depression 1 being least.  She denies SI/HI . A. Support and encouragement provided. Medications given as ordered. R. Patient remains calm and cooperative at this time. No further voiced concerns at this time. Will continue to monitor q 15 minutes for safety.

## 2013-09-04 NOTE — Progress Notes (Signed)
Patient ID: Bonnie Douglas, female   DOB: Jan 11, 1966, 47 y.o.   MRN: [id number] D. Resting in bed with eyes closed.  A. Q 15 minute checks maintained for safety. R. The patient remains safe.

## 2013-09-04 NOTE — B[facility] Group Notes (Signed)
[facility] Group Notes:  (Nursing/MHT/Case Management/Adjunct)  Date:  09/04/2013  Time:  10:52 AM  Type of Therapy:  Psychoeducational Skills  Participation Level:  Minimal  Participation Quality:  Appropriate  Affect:  Irritable  Cognitive:  Alert and Appropriate  Insight:  Improving  Engagement in Group:  Improving  Modes of Intervention:  Discussion, Education and Exploration  Summary of Progress/Problems: Self inventory review with RN and healthy coping skills. Malva Limes 09/04/2013, 10:52 AM

## 2013-09-04 NOTE — Progress Notes (Signed)
Patient ID: Bonnie Douglas, female   DOB: [birth year], 47 y.o.   MRN: [id number] D. The patient was bright and pleasant this evening. Interacting appropriately in the milieu. She stated she enjoyed the music group she attended on day shift. Verbalized her recovery plan was to return to her house but that she will be living alone. Identified her family as her support system. A. Met with patient 1:1. Verbal support provided. Discussed recovery plan. Encouraged to follow up with out patient therapy for support. Reviewed and administered medication. R. Attended and participated in evening wrap up group. Compliant with medication. Denies suicidal ideation.

## 2013-09-05 DIAGNOSIS — F411 Generalized anxiety disorder: Secondary | ICD-10-CM

## 2013-09-05 MED ORDER — ARIPIPRAZOLE 5 MG PO TABS: 5.0000 mg | ORAL_TABLET | Freq: Two times a day (BID) | ORAL | Status: DC

## 2013-09-05 MED ORDER — PREGABALIN 75 MG PO CAPS: 75.0000 mg | ORAL_CAPSULE | Freq: Two times a day (BID) | ORAL | Status: DC

## 2013-09-05 MED ORDER — FLUOXETINE HCL 20 MG PO CAPS: 20.0000 mg | ORAL_CAPSULE | Freq: Every day | ORAL | Status: DC

## 2013-09-05 MED ORDER — TEMAZEPAM 30 MG PO CAPS: 30.0000 mg | ORAL_CAPSULE | Freq: Every evening | ORAL | Status: DC | PRN

## 2013-09-05 MED ORDER — CYCLOBENZAPRINE HCL 10 MG PO TABS: 10.0000 mg | ORAL_TABLET | Freq: Three times a day (TID) | ORAL | Status: DC | PRN

## 2013-09-05 MED ORDER — HYDROXYZINE HCL 25 MG PO TABS: 25.0000 mg | ORAL_TABLET | Freq: Three times a day (TID) | ORAL | Status: DC | PRN

## 2013-09-05 NOTE — B[facility] Suicide Risk Assessment (Signed)
Suicide Risk Assessment  Discharge Assessment     Demographic Factors:  employed, African American female  Mental Status Per Nursing Assessment::   On Admission:  Self-harm thoughts  Current Mental Status by Physician: patient denies suicidal ideation, intent or plan  Loss Factors: NA  Historical Factors: Impulsivity  Risk Reduction Factors:   Sense of responsibility to family, Living with another person, especially a relative and Positive social support  Continued Clinical Symptoms:  Alcohol/Substance Abuse/Dependencies  Cognitive Features That Contribute To Risk:  Closed-mindedness Polarized thinking    Suicide Risk:  Minimal: No identifiable suicidal ideation.  Patients presenting with no risk factors but with morbid ruminations; may be classified as minimal risk based on the severity of the depressive symptoms  Discharge Diagnoses:   AXIS I:  MDD (major depressive disorder), recurrent, severe, with psychosis              Cannabis use disorder  AXIS II:  Deferred AXIS III:   Past Medical History  Diagnosis Date  . Fibromyalgia   . Arthritis   . Tobacco abuse 09/22/2012  . Depression    AXIS IV:  other psychosocial or environmental problems and problems related to social environment AXIS V:  61-70 mild symptoms  Plan Of Care/Follow-up recommendations:  Activity:  as tolerated Diet:  healthy Tests:  routine Other:  patient to keep her after care appointment  Is patient on multiple antipsychotic therapies at discharge:  No   Has Patient had three or more failed trials of antipsychotic monotherapy by history:  No  Recommended Plan for Multiple Antipsychotic Therapies: NA  Thedore Mins, MD 09/05/2013, 9:38 AM

## 2013-09-05 NOTE — Progress Notes (Signed)
[facility] Adult Case Management Discharge Plan :  Will you be returning to the same living situation after discharge: Yes,  returning to own home At discharge, do you have transportation home?:Yes,  access to transportation Do you have the ability to pay for your medications:Yes,  access to meds  Release of information consent forms completed and in the chart;  Patient's signature needed at discharge.  Patient to Follow up at: Follow-up Information   Follow up with [facility] On 09/19/2013. (Appointment scheduled at 11:00 am with Dr. Daleen Bo for medication management. Arrive 30 minutes early with completed paperwork provided.)    Contact information:   [address] [place], [place] [address] Phone: [phone]      Follow up with [facility] On 09/26/2013. (Appointment scheduled at 12:30 pm with Boneta Lucks for therapy.)    Contact information:   [address] [place], [place] [address] Phone: [phone]      Patient denies SI/HI:   Yes,  denies SI/HI    Safety Planning and Suicide Prevention discussed:  Yes,  discussed with pt and pt's daughter.  See suicide prevention education note.   FMLA paperwork completed by CSW and MD and was faxed off today to pt's HR, Ronnell Freshwater (fax number [phone]).  Carmina Miller 09/05/2013, 10:31 AM
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
## 2013-09-05 NOTE — Tx Team (Signed)
Interdisciplinary Treatment Plan Update (Adult)  Date: 09/05/2013  Time Reviewed:  9:45 AM  Progress in Treatment: Attending groups: Yes Participating in groups:  Yes Taking medication as prescribed:  Yes Tolerating medication:  Yes Family/Significant othe contact made: Yes Patient understands diagnosis:  Yes Discussing patient identified problems/goals with staff:  Yes Medical problems stabilized or resolved:  Yes Denies suicidal/homicidal ideation: Yes Issues/concerns per patient self-inventory:  Yes Other:  New problem(s) identified: N/A  Discharge Plan or Barriers: Pt will follow up at [facility] for medication management and therapy.    Reason for Continuation of Hospitalization: Stable to d/c today  Comments: N/A  Estimated length of stay: D/C today  For review of initial/current patient goals, please see plan of care.  Attendees: Patient:  Bonnie Douglas 09/05/2013 10:30 AM   Family:     Physician:  Dr. Jannifer Franklin 09/05/2013 10:30 AM   Nursing:   Joslyn Devon, RN 09/05/2013 10:30 AM   Clinical Social Worker:  Reyes Ivan, LCSW 09/05/2013 10:30 AM   Other: Richelle Ito, LCSW 09/05/2013 10:30 AM   Other:  09/05/2013 10:30 AM   Other:   09/05/2013 10:30 AM   Other:     Other:    Other:    Other:    Other:    Other:      Scribe for Treatment Team:   Carmina Miller, 09/05/2013 , 10:30 AM

## 2013-09-05 NOTE — Progress Notes (Signed)
Recreation Therapy Notes  Date: 11.10.2014 Time: 9:30am Location: [address] Dayroom   Group Topic: Self-Esteem  Goal Area(s) Addresses:  Patient will define self-esteem.  Patient will identify how self-esteem effects his/her life.   Behavioral Response: Absent  Intervention: Designer, multimedia.   Activity: Patient was asked to select a question from provided container, provide an answer and given the opportunity to ask a group member to answer the selected question.   Education:  Self-Esteem, Scientist, physiological, Discharge Planning.   Education Outcome: Needs additional education  Clinical Observations/Feedback: Patient attended group session long enough to give her name and hear opening statements from LRT. Following that patient exited group without explanation and did not return.   Marykay Lex Nakya Weyand, LRT/CTRS  Jearl Klinefelter 09/05/2013 12:32 PM

## 2013-09-05 NOTE — Progress Notes (Signed)
Patient ID: Bonnie Douglas, female   DOB: 03-21-1966, 47 y.o.   MRN: [id number] D:Patient to be discharged per MD order. Her daughter will drop her car off so that patient will have transportation home.  She denies any SI/HI/AVH.  She is eager for discharge.  She presents with stable, neutral mood.  She is calm and cooperative.  She has been attending groups and participating in her treatment.  Her discharge goal is to follow up with a psychiatrist and continue on her medications.  A:Initiate discharge paperwork and review medications with patient.  Continue 15 minutes per protocol until discharge. R: Patient's behavior is appropriate.

## 2013-09-05 NOTE — Discharge Summary (Signed)
Seen and agreed. Joffrey Kerce, MD 

## 2013-09-05 NOTE — Progress Notes (Signed)
Patient ID: Bonnie Douglas, female   DOB: 22-Jun-1966, 47 y.o.   MRN: [id number] Nursing discharge note: patient discharged home per MD order.  Patient received all personal belongings, prescription and discharge instructions.  Patient is to follow up with Dr. Daleen Bo for medication management.  She will also have therapy sessions through [facility].  She denies any SI/HI/AVH.  Patient left ambulatory; she has her own transportation home.

## 2013-09-07 NOTE — Progress Notes (Signed)
Patient Discharge Instructions:  Next Level Care Provider Has Access to the EMR, 09/07/13 Records provided to [facility] via [facility]/Epic access.  Jerelene Redden, 09/07/2013, 3:57 PM

## 2013-09-19 ENCOUNTER — Ambulatory Visit (HOSPITAL_COMMUNITY): Payer: Self-pay | Admitting: Psychiatry

## 2013-09-26 ENCOUNTER — Ambulatory Visit (HOSPITAL_COMMUNITY): Payer: Self-pay | Admitting: Psychiatry

## 2013-11-10 ENCOUNTER — Telehealth: Payer: Self-pay | Admitting: Family Medicine

## 2013-11-10 NOTE — Telephone Encounter (Signed)
Wrong pt

## 2014-03-22 ENCOUNTER — Other Ambulatory Visit: Payer: Self-pay | Admitting: Family Medicine

## 2014-03-22 DIAGNOSIS — R921 Mammographic calcification found on diagnostic imaging of breast: Secondary | ICD-10-CM

## 2014-10-05 ENCOUNTER — Encounter (HOSPITAL_COMMUNITY): Payer: Self-pay | Admitting: Cardiovascular Disease

## 2014-11-02 ENCOUNTER — Other Ambulatory Visit: Payer: Self-pay | Admitting: Family Medicine

## 2014-11-02 ENCOUNTER — Ambulatory Visit
Admission: RE | Admit: 2014-11-02 | Discharge: 2014-11-02 | Disposition: A | Discharge status: Home / Self Care | Payer: 59 | Source: Ambulatory Visit | Attending: Family Medicine | Admitting: Family Medicine

## 2014-11-02 DIAGNOSIS — M25559 Pain in unspecified hip: Secondary | ICD-10-CM

## 2015-11-09 MED FILL — HYDROCODON-APAP 7.5-325: 7.5-325 | 15 days supply | Qty: 90 | Fill #0

## 2015-11-09 MED FILL — clonazePAM 1 MG TABS: 1 | 25 days supply | Qty: 75 | Fill #0

## 2015-12-11 MED FILL — clonazePAM 1 MG TABS: 1 | 25 days supply | Qty: 75 | Fill #0

## 2015-12-11 MED FILL — TEMAZEPAM 30 MG CAPSULE: 30 | 30 days supply | Qty: 30 | Fill #0

## 2015-12-11 MED FILL — HYDROCODON-APAP 7.5-325: 7.5-325 | 15 days supply | Qty: 90 | Fill #0

## 2016-01-07 MED FILL — clonazePAM 1 MG TABS: 1 | 25 days supply | Qty: 75 | Fill #0

## 2016-01-07 MED FILL — TEMAZEPAM 30 MG CAPSULE: 30 | 30 days supply | Qty: 30 | Fill #0

## 2016-01-08 MED FILL — HYDROCODON-APAP 7.5-325: 7.5-325 | 15 days supply | Qty: 90 | Fill #0

## 2016-02-07 MED FILL — clonazePAM 1 MG TABS: 1 | 25 days supply | Qty: 75 | Fill #0

## 2016-02-07 MED FILL — HYDROCODON-APAP 7.5-325: 7.5-325 | 15 days supply | Qty: 90 | Fill #0

## 2016-03-06 MED FILL — HYDROCODON-APAP 7.5-325: 7.5-325 | 15 days supply | Qty: 90 | Fill #0

## 2016-03-06 MED FILL — TEMAZEPAM 30 MG CAPSULE: 30 | 30 days supply | Qty: 30 | Fill #0

## 2016-03-06 MED FILL — clonazePAM 1 MG TABS: 1 | 25 days supply | Qty: 75 | Fill #0

## 2016-04-08 MED FILL — clonazePAM 1 MG TABS: 1 | 25 days supply | Qty: 75 | Fill #0

## 2016-04-08 MED FILL — TEMAZEPAM 30 MG CAPSULE: 30 | 30 days supply | Qty: 30 | Fill #0

## 2016-04-09 MED FILL — HYDROCODON-APAP 7.5-325: 7.5-325 | 15 days supply | Qty: 90 | Fill #0

## 2021-03-29 ENCOUNTER — Emergency Department (HOSPITAL_COMMUNITY)
Admission: EM | Admit: 2021-03-29 | Discharge: 2021-03-29 | Disposition: A | Discharge status: Home / Self Care | Payer: Self-pay | Attending: Emergency Medicine | Admitting: Emergency Medicine

## 2021-03-29 ENCOUNTER — Encounter (HOSPITAL_COMMUNITY): Payer: Self-pay

## 2021-03-29 ENCOUNTER — Other Ambulatory Visit: Payer: Self-pay

## 2021-03-29 ENCOUNTER — Emergency Department (HOSPITAL_COMMUNITY): Payer: Self-pay

## 2021-03-29 DIAGNOSIS — R112 Nausea with vomiting, unspecified: Secondary | ICD-10-CM | POA: Insufficient documentation

## 2021-03-29 DIAGNOSIS — R1013 Epigastric pain: Secondary | ICD-10-CM | POA: Insufficient documentation

## 2021-03-29 DIAGNOSIS — F17210 Nicotine dependence, cigarettes, uncomplicated: Secondary | ICD-10-CM | POA: Insufficient documentation

## 2021-03-29 DIAGNOSIS — R1084 Generalized abdominal pain: Secondary | ICD-10-CM

## 2021-03-29 DIAGNOSIS — R1032 Left lower quadrant pain: Secondary | ICD-10-CM | POA: Insufficient documentation

## 2021-03-29 DIAGNOSIS — I10 Essential (primary) hypertension: Secondary | ICD-10-CM | POA: Insufficient documentation

## 2021-03-29 DIAGNOSIS — Z859 Personal history of malignant neoplasm, unspecified: Secondary | ICD-10-CM | POA: Insufficient documentation

## 2021-03-29 DIAGNOSIS — Z20822 Contact with and (suspected) exposure to covid-19: Secondary | ICD-10-CM | POA: Insufficient documentation

## 2021-03-29 HISTORY — DX: Malignant (primary) neoplasm, unspecified: C80.1

## 2021-03-29 HISTORY — DX: Essential (primary) hypertension: I10

## 2021-03-29 LAB — COMPREHENSIVE METABOLIC PANEL
ALT: 15 U/L (ref 0–44)
AST: 18 U/L (ref 15–41)
Albumin: 3.4 g/dL — ABNORMAL LOW (ref 3.5–5.0)
Alkaline Phosphatase: 86 U/L (ref 38–126)
Anion gap: 5 (ref 5–15)
BUN: 9 mg/dL (ref 6–20)
CO2: 26 mmol/L (ref 22–32)
Calcium: 8.7 mg/dL — ABNORMAL LOW (ref 8.9–10.3)
Chloride: 107 mmol/L (ref 98–111)
Creatinine, Ser: 0.76 mg/dL (ref 0.44–1.00)
GFR, Estimated: >60 mL/min (ref >60)
Glucose, Bld: 96 mg/dL (ref 70–99)
Potassium: 3.4 mmol/L — ABNORMAL LOW (ref 3.5–5.1)
Sodium: 138 mmol/L (ref 135–145)
Total Bilirubin: <0.1 mg/dL — ABNORMAL LOW (ref 0.3–1.2)
Total Protein: 6.7 g/dL (ref 6.5–8.1)

## 2021-03-29 LAB — CBC WITH DIFFERENTIAL/PLATELET
Abs Immature Granulocytes: 0.01 10*3/uL (ref 0.00–0.07)
Basophils Absolute: 0 10*3/uL (ref 0.0–0.1)
Basophils Relative: 1 %
Eosinophils Absolute: 0.2 10*3/uL (ref 0.0–0.5)
Eosinophils Relative: 3 %
HCT: 35.5 % — ABNORMAL LOW (ref 36.0–46.0)
Hemoglobin: 11.3 g/dL — ABNORMAL LOW (ref 12.0–15.0)
Immature Granulocytes: 0 %
Lymphocytes Relative: 23 %
Lymphs Abs: 1.5 10*3/uL (ref 0.7–4.0)
MCH: 30.1 pg (ref 26.0–34.0)
MCHC: 31.8 g/dL (ref 30.0–36.0)
MCV: 94.7 fL (ref 80.0–100.0)
Monocytes Absolute: 0.4 10*3/uL (ref 0.1–1.0)
Monocytes Relative: 6 %
Neutro Abs: 4.3 10*3/uL (ref 1.7–7.7)
Neutrophils Relative %: 67 %
Platelets: 173 10*3/uL (ref 150–400)
RBC: 3.75 MIL/uL — ABNORMAL LOW (ref 3.87–5.11)
RDW: 14 % (ref 11.5–15.5)
WBC: 6.4 10*3/uL (ref 4.0–10.5)
nRBC: 0 % (ref 0.0–0.2)

## 2021-03-29 LAB — ETHANOL: Alcohol, Ethyl (B): <10 mg/dL (ref <10)

## 2021-03-29 LAB — RESP PANEL BY RT-PCR (FLU A&B, COVID) ARPGX2
Influenza A by PCR: NEGATIVE
Influenza B by PCR: NEGATIVE
SARS Coronavirus 2 by RT PCR: NEGATIVE

## 2021-03-29 LAB — URINALYSIS, ROUTINE W REFLEX MICROSCOPIC
Bilirubin Urine: NEGATIVE
Glucose, UA: NEGATIVE mg/dL
Hgb urine dipstick: NEGATIVE
Ketones, ur: NEGATIVE mg/dL
Leukocytes,Ua: NEGATIVE
Nitrite: NEGATIVE
Protein, ur: NEGATIVE mg/dL
Specific Gravity, Urine: 1.012 (ref 1.005–1.030)
pH: 6 (ref 5.0–8.0)

## 2021-03-29 LAB — RAPID URINE DRUG SCREEN, HOSP PERFORMED
Amphetamines: NOT DETECTED
Barbiturates: NOT DETECTED
Benzodiazepines: NOT DETECTED
Cocaine: POSITIVE — AB
Opiates: POSITIVE — AB
Tetrahydrocannabinol: POSITIVE — AB

## 2021-03-29 LAB — LIPASE, BLOOD: Lipase: 20 U/L (ref 11–51)

## 2021-03-29 NOTE — ED Notes (Signed)
Pt ambulatory and from restroom with steady gait

## 2021-03-29 NOTE — ED Notes (Signed)
Pt tolerating PO fluids

## 2021-03-29 NOTE — ED Triage Notes (Signed)
Pt from home via ems; woke up this am w/ epigastric pain, N/V, and diffuse lower abd pain radiating for flanks; pain does not increase with palpation; denies hx pancreatitis, gallbladder issues; hx rectal cancer; 4 zofran, 300 mLs IVF given pta, 20 ga LH; 12 lead unremarkable; denies bowel/bladder issues; endorses diaphoresis which accompanied emesis; hx of similar episode which resolved on its own; denies pain currently, endorses some nausea  162/84 HR 80  RR 26  cbg 99 98% RA

## 2021-03-29 NOTE — ED Notes (Signed)
Provided pt with water.

## 2021-03-29 NOTE — ED Provider Notes (Signed)
[place] EMERGENCY DEPARTMENT Provider Note   CSN: [id number] Arrival date & time: 03/29/21  1052     History Chief Complaint  Patient presents with  . Abdominal Pain  . Nausea  . Emesis    Bonnie Douglas is a 55 y.o. female.  HPI 55 year old female with a history of anxiety, depression, fibromyalgia, hypertension presents to the ER with complaints of epigastric pain, nausea, vomiting which began this morning.  Patient states he tried to have a bowel movement and had some straining.  She reports epigastric pain, left upper quadrant pain and left lower quadrant pain.  Denies any dysuria or hematuria.  No vaginal bleeding or discharge.  History of anal cancer with chemo and radiation, this was done back in [place] and she does not have any specialist that she follows with here.  She denies any recent drug or alcohol use.  Denies any clinical signs.  No fevers or chills.  Last bowel movement was yesterday and normal.    Past Medical History:  Diagnosis Date  . Arthritis   . Cancer ([place])   . Depression   . Fibromyalgia   . Hypertension   . Tobacco abuse 09/22/2012    Patient Active Problem List   Diagnosis Date Noted  . PMDD (premenstrual dysphoric disorder) 09/01/2013  . Cannabis abuse 09/01/2013  . MDD (major depressive disorder), recurrent, severe, with psychosis ([place]) 08/31/2013  . Chest pain, with multiple risk factors for CAD 09/22/2012  . Tobacco abuse 09/22/2012  . Family history of premature CAD 09/22/2012    Past Surgical History:  Procedure Laterality Date  . LEFT HEART CATHETERIZATION WITH CORONARY ANGIOGRAM N/A 09/22/2012   Procedure: LEFT HEART CATHETERIZATION WITH CORONARY ANGIOGRAM;  Surgeon: Troy Sine, MD;  Location[facility] C[facility] LAB;  Service: Cardiovascular;  Laterality: N/A;  . uterine ablation       OB History   No obstetric history on file.     Family History  Problem Relation Age of Onset  . Heart attack Other      Social History   Tobacco Use  . Smoking status: Current Every Day Smoker    Packs/day: 1.00    Types: Cigarettes  Substance Use Topics  . Alcohol use: No  . Drug use: No    Home Medications Prior to Admission medications   Medication Sig Start Date End Date Taking? Authorizing Provider  ARIPiprazole (ABILIFY) 5 MG tablet Take 1 tablet (5 mg total) by mouth 2 (two) times daily. 09/05/13   Patrecia Pour, NP  cyclobenzaprine (FLEXERIL) 10 MG tablet Take 1 tablet (10 mg total) by mouth 3 (three) times daily as needed for muscle spasms. 09/05/13   Patrecia Pour, NP  FLUoxetine (PROZAC) 20 MG capsule Take 1 capsule (20 mg total) by mouth daily. 09/05/13   Patrecia Pour, NP  hydrOXYzine (ATARAX/VISTARIL) 25 MG tablet Take 1 tablet (25 mg total) by mouth 3 (three) times daily as needed for anxiety. 09/05/13   Patrecia Pour, NP  pregabalin (LYRICA) 75 MG capsule Take 1 capsule (75 mg total) by mouth 2 (two) times daily. 09/05/13   Patrecia Pour, NP  temazepam (RESTORIL) 30 MG capsule Take 1 capsule (30 mg total) by mouth at bedtime as needed for sleep. 09/05/13   Patrecia Pour, NP    Allergies    Vicodin [hydrocodone-acetaminophen]  Review of Systems   Review of Systems  Constitutional: Negative for chills and fever.  HENT: Negative for  ear pain and sore throat.   Eyes: Negative for pain and visual disturbance.  Respiratory: Negative for cough and shortness of breath.   Cardiovascular: Negative for chest pain and palpitations.  Gastrointestinal: Positive for abdominal distention, nausea and vomiting. Negative for abdominal pain and constipation.  Genitourinary: Negative for dysuria and hematuria.  Musculoskeletal: Negative for arthralgias and back pain.  Skin: Negative for color change and rash.  Neurological: Negative for seizures and syncope.  All other systems reviewed and are negative.   Physical Exam Updated Vital Signs BP (!) 147/94   Pulse 73   Temp 98 F  (36.7 C)   Resp 12   Ht 5\' 9"  (1.753 m)   Wt 90.7 kg   SpO2 97%   BMI 29.53 kg/m   Physical Exam Vitals and nursing note reviewed.  Constitutional:      General: She is not in acute distress.    Appearance: She is well-developed.  HENT:     Head: Normocephalic and atraumatic.  Eyes:     Conjunctiva/sclera: Conjunctivae normal.  Cardiovascular:     Rate and Rhythm: Normal rate and regular rhythm.     Heart sounds: No murmur heard.   Pulmonary:     Effort: Pulmonary effort is normal. No respiratory distress.     Breath sounds: Normal breath sounds.  Abdominal:     Palpations: Abdomen is soft.     Tenderness: There is abdominal tenderness.       Comments: Generalized tenderness to the epigastrium, left upper quadrant, left lower quadrant.  No peritoneal signs, no guarding.  No CVA tenderness bilaterally.  Musculoskeletal:     Cervical back: Neck supple.  Skin:    General: Skin is warm and dry.  Neurological:     Mental Status: She is alert.     ED Results / Procedures / Treatments   Labs (all labs ordered are listed, but only abnormal results are displayed) Labs Reviewed  CBC WITH DIFFERENTIAL/PLATELET - Abnormal; Notable for the following components:      Result Value   RBC 3.75 (*)    Hemoglobin 11.3 (*)    HCT 35.5 (*)    All other components within normal limits  COMPREHENSIVE METABOLIC PANEL - Abnormal; Notable for the following components:   Potassium 3.4 (*)    Calcium 8.7 (*)    Albumin 3.4 (*)    Total Bilirubin <0.1 (*)    All other components within normal limits  RAPID URINE DRUG SCREEN, HOSP PERFORMED - Abnormal; Notable for the following components:   Opiates POSITIVE (*)    Cocaine POSITIVE (*)    Tetrahydrocannabinol POSITIVE (*)    All other components within normal limits  RESP PANEL BY RT-PCR (FLU A&B, COVID) ARPGX2  LIPASE, BLOOD  URINALYSIS, ROUTINE W REFLEX MICROSCOPIC  ETHANOL    EKG None  Radiology CT ABDOMEN PELVIS WO  CONTRAST  Result Date: 03/29/2021 CLINICAL DATA:  Abdominal pain and vomiting EXAM: CT ABDOMEN AND PELVIS WITHOUT CONTRAST TECHNIQUE: Multidetector CT imaging of the abdomen and pelvis was performed following the standard protocol without oral or IV contrast. COMPARISON:  None. FINDINGS: Lower chest: Lung bases are clear. Hepatobiliary: There is a focus of decreased attenuation in the anterior segment of the right lobe of the liver measuring 6 x 5 mm. A focus of decreased attenuation in the right lobe of the liver near the dome somewhat posteriorly measuring 5 x 4 mm also noted. These small areas of decreased attenuation likely represent either  small cysts or hamartomas. No other liver lesions evident on this noncontrast enhanced study. Gallbladder wall is not appreciably thickened. There is no biliary duct dilatation. Pancreas: There is no pancreatic mass or inflammatory focus. Spleen: No splenic lesions are evident. Adrenals/Urinary Tract: Adrenals bilaterally appear unremarkable. There is no appreciable renal mass or hydronephrosis on either side. There is no appreciable renal or ureteral calculus on either side. Urinary bladder is midline with wall thickness within normal limits. Stomach/Bowel: There is minimal sigmoid colon diverticulosis. No evident diverticulitis. There is no appreciable bowel wall or mesenteric thickening. There is moderate stool in the colon. There is no appreciable bowel obstruction. Terminal ileum appears normal. No periappendiceal region inflammation is evident. No free air or portal venous air. Vascular/Lymphatic: There is no abdominal aortic aneurysm. There are foci of aortic and iliac artery atherosclerotic calcification. No adenopathy is appreciable in the abdomen or pelvis. Reproductive: Uterus is somewhat diminutive in size, likely due to postmenopausal state. No uterine or adnexal masses are appreciable. Other: No evident abscess or ascites in the abdomen or pelvis.  Musculoskeletal: There is degenerative change at L5-S1 with disc space narrowing and vacuum phenomenon. There are no blastic or lytic bone lesions. No abdominal wall or intramuscular lesions are evident. IMPRESSION: 1. No evident bowel wall thickening or bowel obstruction. No abscess in the abdomen or pelvis. No periappendiceal region inflammatory change. 2. No renal or ureteral calculi. No hydronephrosis. Urinary bladder wall thickness normal. 3.  Aortic Atherosclerosis (ICD10-I70.0). Electronically Signed   By: Lowella Grip III M.D.   On: 03/29/2021 13:59    Procedures Procedures   Medications Ordered in ED Medications - No data to display  ED Course  I have reviewed the triage vital signs and the nursing notes.  Pertinent labs & imaging results that were available during my care of the patient were reviewed by me and considered in my medical decision making (see chart for details).  Clinical Course as of 03/29/21 1459  Fri Mar 29, 7344  7561 55 year old female presents to the ER with complaints of epigastric, left upper quadrant and left lower quadrant pain.  On arrival, she is uncomfortable appearing, however no acute distress, resting comfortably in ER bed.  Vitals overall reassuring, not tachycardic.  Slightly hypertensive.  Physical exam with generalized tenderness in the epigastrium, left upper quadrant and left lower quadrant.  No peritoneal signs, no guarding.  No CVA tenderness.  She did not have any pelvic complaints.  DDx includes viral gastroenteritis, COVID-19, colitis, neoplasm, pancreatitis, reflux, cholecystitis [MB]  1339 CBC unremarkable, no leukocytosis, mildly decreased hemoglobin 11.3 but appears stable.  CMP with very mild hypokalemia 3.4 and hypocalcemia of 8.7, normal LFTs and creatinine.  COVID and flu are negative.  Negative EtOH.  UA without evidence of UTI or blood.  Lipase is normal. [MB]  1413 CT ABDOMEN PELVIS WO CONTRAST IMPRESSION: 1. No evident bowel  wall thickening or bowel obstruction. No abscess in the abdomen or pelvis. No periappendiceal region inflammatory change.  2. No renal or ureteral calculi. No hydronephrosis. Urinary bladder wall thickness normal.  3. Aortic Atherosclerosis (ICD10-I70.0).     [MB]  1413 Opiates(!): POSITIVE [MB]  1413 COCAINE(!): POSITIVE [MB]  1413 Tetrahydrocannabinol(!): POSITIVE [MB]  1413 Patient with unremarkable CT of the abdomen, she was positive for opiates, cocaine, THC.  Suspect possible cannabis hyperemesis.  Will fluid challenge and reevaluate. [MB]  8841 Patient tolerated p.o. fluid challenge well.  Suspect possible cannabis hyperemesis, discussed abstaining from cannabis use  with the patient.  She is overall feeling better and is asking for food.  We discussed return precautions.  She is declining and is agreeable.  Stable for discharge. [MB]    Clinical Course User Index [MB] Lyndel Safe   MDM Rules/Calculators/A&P                           Final Clinical Impression(s) / ED Diagnoses Final diagnoses:  None    Rx / DC Orders ED Discharge Orders    None       Garald Balding, PA-C 03/29/21 1500    Pattricia Boss, MD 04/01/21 1407

## 2021-03-29 NOTE — Discharge Instructions (Addendum)
You were evaluated in the Emergency Department and after careful evaluation, we did not find any emergent condition requiring admission or further testing in the hospital.  I suspect her symptoms could be due to the use of marijuana.  Please abstain from using SVT Likely contributing to her symptoms.  Please return to the Emergency Department if you experience any worsening of your condition.  We encourage you to follow up with a primary care provider.  Thank you for allowing [place] to be a part of your care.

## 2021-04-07 ENCOUNTER — Emergency Department (HOSPITAL_COMMUNITY)
Admission: EM | Admit: 2021-04-07 | Discharge: 2021-04-07 | Disposition: A | Discharge status: Home / Self Care | Payer: Medicaid - Out of State | Attending: Emergency Medicine | Admitting: Emergency Medicine

## 2021-04-07 ENCOUNTER — Other Ambulatory Visit: Payer: Self-pay

## 2021-04-07 ENCOUNTER — Encounter (HOSPITAL_COMMUNITY): Payer: Self-pay | Admitting: Pharmacy Technician

## 2021-04-07 ENCOUNTER — Emergency Department (HOSPITAL_COMMUNITY): Payer: Medicaid - Out of State

## 2021-04-07 DIAGNOSIS — Z20822 Contact with and (suspected) exposure to covid-19: Secondary | ICD-10-CM | POA: Diagnosis not present

## 2021-04-07 DIAGNOSIS — R059 Cough, unspecified: Secondary | ICD-10-CM

## 2021-04-07 DIAGNOSIS — F17210 Nicotine dependence, cigarettes, uncomplicated: Secondary | ICD-10-CM | POA: Insufficient documentation

## 2021-04-07 DIAGNOSIS — I10 Essential (primary) hypertension: Secondary | ICD-10-CM | POA: Insufficient documentation

## 2021-04-07 DIAGNOSIS — R0602 Shortness of breath: Secondary | ICD-10-CM | POA: Diagnosis present

## 2021-04-07 DIAGNOSIS — Z79899 Other long term (current) drug therapy: Secondary | ICD-10-CM | POA: Diagnosis not present

## 2021-04-07 DIAGNOSIS — Z955 Presence of coronary angioplasty implant and graft: Secondary | ICD-10-CM | POA: Diagnosis not present

## 2021-04-07 DIAGNOSIS — Z85048 Personal history of other malignant neoplasm of rectum, rectosigmoid junction, and anus: Secondary | ICD-10-CM | POA: Insufficient documentation

## 2021-04-07 DIAGNOSIS — Z28310 Unvaccinated for covid-19: Secondary | ICD-10-CM | POA: Diagnosis not present

## 2021-04-07 DIAGNOSIS — R079 Chest pain, unspecified: Secondary | ICD-10-CM

## 2021-04-07 DIAGNOSIS — J189 Pneumonia, unspecified organism: Secondary | ICD-10-CM | POA: Diagnosis not present

## 2021-04-07 LAB — CBC
HCT: 33.5 % — ABNORMAL LOW (ref 36.0–46.0)
Hemoglobin: 10.6 g/dL — ABNORMAL LOW (ref 12.0–15.0)
MCH: 30 pg (ref 26.0–34.0)
MCHC: 31.6 g/dL (ref 30.0–36.0)
MCV: 94.9 fL (ref 80.0–100.0)
Platelets: 185 10*3/uL (ref 150–400)
RBC: 3.53 MIL/uL — ABNORMAL LOW (ref 3.87–5.11)
RDW: 14 % (ref 11.5–15.5)
WBC: 7.2 10*3/uL (ref 4.0–10.5)
nRBC: 0 % (ref 0.0–0.2)

## 2021-04-07 LAB — I-STAT BETA HCG BLOOD, ED (MC, WL, AP ONLY): I-stat hCG, quantitative: <5 m[IU]/mL (ref <5)

## 2021-04-07 LAB — BASIC METABOLIC PANEL
Anion gap: 8 (ref 5–15)
BUN: 9 mg/dL (ref 6–20)
CO2: 29 mmol/L (ref 22–32)
Calcium: 9.2 mg/dL (ref 8.9–10.3)
Chloride: 102 mmol/L (ref 98–111)
Creatinine, Ser: 0.93 mg/dL (ref 0.44–1.00)
GFR, Estimated: >60 mL/min (ref >60)
Glucose, Bld: 101 mg/dL — ABNORMAL HIGH (ref 70–99)
Potassium: 3.8 mmol/L (ref 3.5–5.1)
Sodium: 139 mmol/L (ref 135–145)

## 2021-04-07 LAB — TROPONIN I (HIGH SENSITIVITY)
Troponin I (High Sensitivity): 5 ng/L (ref <18)
Troponin I (High Sensitivity): 5 ng/L (ref <18)

## 2021-04-07 LAB — RESP PANEL BY RT-PCR (FLU A&B, COVID) ARPGX2
Influenza A by PCR: NEGATIVE
Influenza B by PCR: NEGATIVE
SARS Coronavirus 2 by RT PCR: NEGATIVE

## 2021-04-07 LAB — D-DIMER, QUANTITATIVE: D-Dimer, Quant: 0.96 ug/mL-FEU — ABNORMAL HIGH (ref 0.00–0.50)

## 2021-04-07 MED ORDER — ONDANSETRON HCL 4 MG/2ML IJ SOLN
4.0000 mg | Freq: Once | INTRAMUSCULAR | Status: AC
  Administered 2021-04-07: 4 mg via INTRAVENOUS
  Filled 2021-04-07: qty 2

## 2021-04-07 MED ORDER — BENZONATATE 100 MG PO CAPS: 100.0000 mg | ORAL_CAPSULE | Freq: Three times a day (TID) | ORAL | 0 refills | Status: DC

## 2021-04-07 MED ORDER — AMOXICILLIN 500 MG PO CAPS
1000.0000 mg | ORAL_CAPSULE | Freq: Once | ORAL | Status: AC
  Administered 2021-04-07: 1000 mg via ORAL
  Filled 2021-04-07: qty 2

## 2021-04-07 MED ORDER — MORPHINE SULFATE (PF) 4 MG/ML IV SOLN
4.0000 mg | Freq: Once | INTRAVENOUS | Status: AC
  Administered 2021-04-07: 4 mg via INTRAVENOUS
  Filled 2021-04-07: qty 1

## 2021-04-07 MED ORDER — AZITHROMYCIN 250 MG PO TABS: 250.0000 mg | ORAL_TABLET | Freq: Every day | ORAL | 0 refills | Status: DC

## 2021-04-07 MED ORDER — AMOXICILLIN 500 MG PO CAPS: 1000.0000 mg | ORAL_CAPSULE | Freq: Three times a day (TID) | ORAL | 0 refills | Status: AC

## 2021-04-07 MED ORDER — AZITHROMYCIN 250 MG PO TABS
500.0000 mg | ORAL_TABLET | Freq: Once | ORAL | Status: AC
  Administered 2021-04-07: 500 mg via ORAL
  Filled 2021-04-07: qty 2

## 2021-04-07 MED ORDER — IOHEXOL 350 MG/ML SOLN
75.0000 mL | Freq: Once | INTRAVENOUS | Status: AC | PRN
  Administered 2021-04-07: 75 mL via INTRAVENOUS

## 2021-04-07 NOTE — ED Triage Notes (Signed)
Pt here pov with reports of chest pain, cough and shob X3 days. Pt denies sick contacts. Pt talking in complete sentences.

## 2021-04-07 NOTE — ED Notes (Signed)
ED Provider at bedside, [facility] [place]

## 2021-04-07 NOTE — Discharge Instructions (Signed)
Please read and follow all provided instructions.  Your diagnoses today include:  1. Multifocal pneumonia   2. Cough   3. Chest pain, unspecified type     Tests performed today include: Blood counts and electrolytes Chest x-ray -- shows pneumonia CT scan of the chest - shows pneumonia, no blood clots COVID test - negative Cardiac enzymes - no sign of stress on the heart Vital signs. See below for your results today.   Medications prescribed:  Amoxicillin - antibiotic  You have been prescribed an antibiotic medicine: take the entire course of medicine even if you are feeling better. Stopping early can cause the antibiotic not to work.  Azithromycin - antibiotic for respiratory infection  You have been prescribed an antibiotic medicine: take the entire course of medicine even if you are feeling better. Stopping early can cause the antibiotic not to work.  Tessalon Perles - cough suppressant medication  Take any prescribed medications only as directed.  Home care instructions:  Follow any educational materials contained in this packet.  Take the complete course of antibiotics that you were prescribed.   BE VERY CAREFUL not to take multiple medicines containing Tylenol (also called acetaminophen). Doing so can lead to an overdose which can damage your liver and cause liver failure and possibly death.   Follow-up instructions: Please follow-up with your primary care provider in the next 3 days for further evaluation of your symptoms and to ensure resolution of your infection.   Return instructions:  Please return to the Emergency Department if you experience worsening symptoms.  Return immediately with worsening breathing, worsening shortness of breath, or if you feel it is taking you more effort to breathe.  Please return if you have any other emergent concerns.  Additional Information:  Your vital signs today were: BP 118/85   Pulse 82   Temp 99 F (37.2 C) (Oral)    Resp 16   Ht 5\' 9"  (1.753 m)   Wt 90.7 kg   SpO2 96%   BMI 29.53 kg/m  If your blood pressure (BP) was elevated above 135/85 this visit, please have this repeated by your doctor within one month. --------------

## 2021-04-07 NOTE — ED Provider Notes (Signed)
[facility] EMERGENCY DEPARTMENT Provider Note   CSN: [id number] Arrival date & time: 04/07/21  1308     History Chief Complaint  Patient presents with   Chest Pain   Cough    Bonnie Douglas is a 55 y.o. female.  Patient with h/o polysubstance abuse, history of rectal CA in 2019 treated with chemo & radiation -- presents with left sided pleuritic chest pain x 4 days. She has associated cough, SOB, and hemoptysis.  She has felt feverish with chills, but no documented fever.  Reports nausea and vomiting.  No bladder or bowel issues.  Denies any sick contacts or known covid exposures.  She is not vaccinated for covid.  Patient denies other risk factors for pulmonary embolism including: unilateral leg swelling, history of DVT/PE/other blood clots, use of exogenous hormones, recent immobilizations, recent surgery, recent travel (>4hr segment), hemoptysis.  She took amoxicillin twice from a leftover prescription with mild improvement in symptoms.  Also using Tylenol PM which helps her sleep.  Reports she takes medication for HTN but doesn't remember which one. She was seen in ED 9 days ago for abdominal pain, had CT abd/pelvis. States those symptoms have improved. Was negative for COVID at that time.       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     Family History  Problem Relation Age of Onset   Heart attack Other     Social History   Tobacco Use   Smoking status: Every Day    Packs/day: 1.00    Pack years: 0.00    Types: Cigarettes  Substance Use Topics   Alcohol use: No   Drug use: No    Home Medications Prior to Admission medications   Medication Sig Start Date End Date Taking? Authorizing Provider  amLODipine (NORVASC) 5 MG tablet Take 5 mg by mouth daily.   Yes [provider]  ARIPiprazole (ABILIFY) 5 MG tablet Take 1 tablet (5 mg total) by mouth 2 (two) times daily. Patient not taking: Reported on 04/07/2021 09/05/13   Patrecia Pour, NP  cyclobenzaprine (FLEXERIL) 10 MG tablet Take 1 tablet (10 mg total) by mouth 3 (three) times daily as needed for muscle spasms. Patient not taking: Reported on 04/07/2021 09/05/13   Patrecia Pour, NP  FLUoxetine (PROZAC) 20 MG capsule Take 1 capsule (20 mg total) by mouth daily. Patient not taking: Reported on 04/07/2021 09/05/13   Patrecia Pour, NP  hydrOXYzine (ATARAX/VISTARIL) 25 MG tablet Take 1 tablet (25 mg total) by mouth 3 (three) times daily as needed for anxiety. Patient not taking: Reported on  04/07/2021 09/05/13   Patrecia Pour, NP  pregabalin (LYRICA) 75 MG capsule Take 1 capsule (75 mg total) by mouth 2 (two) times daily. Patient not taking: Reported on 04/07/2021 09/05/13   Patrecia Pour, NP  temazepam (RESTORIL) 30 MG capsule Take 1 capsule (30 mg total) by mouth at bedtime as needed for sleep. Patient not taking: Reported on 04/07/2021 09/05/13   Patrecia Pour, NP    Allergies    Biaxin [clarithromycin] and Vicodin [hydrocodone-acetaminophen]  Review of Systems   Review of Systems  Constitutional:  Positive for chills. Negative for fever (no documented).  HENT:   Negative for rhinorrhea and sore throat.   Eyes:  Negative for redness.  Respiratory:  Positive for cough and shortness of breath.   Cardiovascular:  Positive for chest pain.  Gastrointestinal:  Negative for abdominal pain, diarrhea, nausea and vomiting.  Genitourinary:  Negative for dysuria, frequency, hematuria and urgency.  Musculoskeletal:  Negative for myalgias.  Skin:  Negative for rash.  Neurological:  Negative for headaches.   Physical Exam Updated Vital Signs BP (!) 149/85 (BP Location: Right Arm)   Pulse 68   Temp 99 F (37.2 C) (Oral)   Resp 18   Ht 5\' 9"  (1.610 m)   Wt 90.7 kg   SpO2 98%   BMI 29.53 kg/m   Physical Exam Vitals and nursing note reviewed.  Constitutional:      General: She is not in acute distress.    Appearance: She is well-developed.  HENT:     Head: Normocephalic and atraumatic.     Right Ear: External ear normal.     Left Ear: External ear normal.     Nose: Nose normal.  Eyes:     Conjunctiva/sclera: Conjunctivae normal.  Cardiovascular:     Rate and Rhythm: Normal rate and regular rhythm.     Heart sounds: No murmur heard. Pulmonary:     Effort: No respiratory distress.     Breath sounds: Rhonchi (Scattered) present. No wheezing or rales.     Comments: Occasional cough during exam. Abdominal:     Palpations: Abdomen is soft.     Tenderness: There is no abdominal tenderness. There is no guarding or rebound.  Musculoskeletal:     Cervical back: Normal range of motion and neck supple.     Right lower leg: No edema.     Left lower leg: No edema.  Skin:    General: Skin is warm and dry.     Findings: No rash.  Neurological:     General: No focal deficit present.     Mental Status: She is alert. Mental status is at baseline.     Motor: No weakness.  Psychiatric:        Mood and Affect: Mood normal.    ED Results / Procedures / Treatments   Labs (all labs ordered are listed, but only abnormal results are displayed) Labs Reviewed   BASIC METABOLIC PANEL - Abnormal; Notable for the following components:      Result Value   Glucose, Bld 101 (*)    All other components within normal limits  CBC - Abnormal; Notable for the following components:   RBC 3.53 (*)    Hemoglobin 10.6 (*)    HCT 33.5 (*)    All other components within normal limits  D-DIMER, QUANTITATIVE - Abnormal; Notable for the following components:   D-Dimer, Quant 0.96 (*)    All other components within normal limits  RESP  PANEL BY RT-PCR (FLU A&B, COVID) [id number]  I-STAT BETA HCG BLOOD, ED ([facility], [facility], [facility] ONLY)  TROPONIN I (HIGH SENSITIVITY)  TROPONIN I (HIGH SENSITIVITY)    ED ECG REPORT   Date: 04/07/2021  Rate: 104  Rhythm: sinus tachycardia  QRS Axis: normal  Intervals: normal  ST/T Wave abnormalities: nonspecific ST changes and nonspecific T wave changes  Conduction Disutrbances:none  Narrative Interpretation:   Old EKG Reviewed: new non-specific ST/T wave change.   I have personally reviewed the EKG tracing and agree with the computerized printout as noted.   Radiology DG Chest 2 View  Result Date: 04/07/2021 CLINICAL DATA:  Chest pain and hemoptysis 4 days. EXAM: CHEST - 2 VIEW COMPARISON:  09/22/2012 FINDINGS: Lungs are adequately inflated with mild opacification over the lingula. No effusion. Cardiomediastinal silhouette and remainder of the exam is unchanged. IMPRESSION: Mild opacification over the lingula which may be due to infection. Electronically Signed   By: Marin Olp M.D.   On: 04/07/2021 14:04   CT Angio Chest PE W and/or Wo Contrast  Result Date: 04/07/2021 CLINICAL DATA:  Chest pain, cough and shortness of breath. EXAM: CT ANGIOGRAPHY CHEST WITH CONTRAST TECHNIQUE: Multidetector CT imaging of the chest was performed using the standard protocol during bolus administration of intravenous contrast. Multiplanar CT image reconstructions and MIPs were obtained to evaluate the vascular anatomy. CONTRAST:  23mL OMNIPAQUE IOHEXOL  350 MG/ML SOLN COMPARISON:  None. FINDINGS: Cardiovascular: The heart is normal in size. No pericardial effusion. The aorta is normal in caliber. No dissection. No atherosclerotic calcifications. Scattered coronary artery calcifications are noted. The pulmonary arterial tree is well opacified. No filling defects to suggest pulmonary embolism. Mediastinum/Nodes: Mediastinal lymphadenopathy the. There are enlarged AP window and subcarinal lymph nodes. The subcarinal node measures a maximum of 2 cm. Small bilateral hilar nodes. The esophagus is grossly normal.  The thyroid gland is unremarkable. Lungs/Pleura: Mild emphysematous changes are noted. There are patchy E bilateral pulmonary infiltrates most notably in the right middle lobe and lingula. Peribronchial thickening and nodular airspace opacities likely reflecting bronchopneumonia. More patchy ground-glass opacity in both lower lobes. No pleural effusions. No pulmonary edema. Upper Abdomen: No significant upper abdominal findings. Small low-attenuation liver lesion is likely a benign cyst. Musculoskeletal: No breast masses, supraclavicular or axillary adenopathy. Small scattered supraclavicular lymph nodes. Review of the MIP images confirms the above findings. IMPRESSION: 1. No CT findings for pulmonary embolism. 2. Normal thoracic aorta. 3. Patchy bilateral pulmonary infiltrates, most notably in the right middle lobe and lingula. 4. Mediastinal and hilar lymphadenopathy, likely reactive, given the lung findings. A follow-up chest CT with contrast may be helpful after appropriate treatment to make sure this resolves. 5. Mild emphysematous changes. Electronically Signed   By: Marijo Sanes M.D.   On: 04/07/2021 20:45    Procedures Procedures   Medications Ordered in ED Medications - No data to display  ED Course  I have reviewed the triage vital signs and the nursing notes.  Pertinent labs & imaging results that were available during my care of the  patient were reviewed by me and considered in my medical decision making (see chart for details).  Patient seen and examined. Work-up reviewed to this point.  Troponins negative x2.  EKG reviewed.  Will obtain D-dimer given history of cancer, tachycardia, chest pain, shortness of breath with hemoptysis.  Chest x-ray does have question of pneumonia.  Vital signs reviewed and are as follows: BP (!) 149/85 (BP Location: Right Arm)  Pulse 68   Temp 99 F (37.2 C) (Oral)   Resp 18   Ht 5\' 9"  (1.753 m)   Wt 90.7 kg   SpO2 98%   BMI 29.53 kg/m   D-dimer elevated.  We will proceed with CT imaging of the chest to rule out PE.  CT will better evaluate for possibility of pneumonia as well.  10:48 PM CT did not show blood clots. Does show multifocal pneumonia.  COVID testing was sent which was negative.  Patient updated on all results.  She has been treated with IV pain medication for chest pain.  Plan for discharged home on amoxicillin and azithromycin, Tessalon.  Encouraged return to the emergency department with worsening shortness of breath, uncontrolled chest pain, high fever or other concerns.  KRISY DIX was evaluated in Emergency Department on 04/07/2021 for the symptoms described in the history of present illness. She was evaluated in the context of the global COVID-19 pandemic, which necessitated consideration that the patient might be at risk for infection with the SARS-CoV-2 virus that causes COVID-19. Institutional protocols and algorithms that pertain to the evaluation of patients at risk for COVID-19 are in a state of rapid change based on information released by regulatory bodies including the CDC and federal and state organizations. These policies and algorithms were followed during the patient's care in the ED.      MDM Rules/Calculators/A&P                          Patient presented with cough and chest pain, hemoptysis.  D-dimer was elevated prompting a CT of the chest  which showed multifocal pneumonia, no PE.  Troponin negative x2.  EKG nonischemic.  Lab work-up otherwise reassuring.  Patient is not hypoxic, tachycardic or hypotensive.  Feel that she can be discharged home with close follow-up and return if needed.   Final Clinical Impression(s) / ED Diagnoses Final diagnoses:  Multifocal pneumonia  Cough  Chest pain, unspecified type    Rx / DC Orders ED Discharge Orders          Ordered    amoxicillin (AMOXIL) 500 MG capsule  3 times daily        04/07/21 2245    azithromycin (ZITHROMAX) 250 MG tablet  Daily        04/07/21 2245    benzonatate (TESSALON) 100 MG capsule  Every 8 hours        04/07/21 2245             Carlisle Cater, PA-C 04/07/21 2250    Charlesetta Shanks, MD 04/09/21 2134

## 2021-04-07 NOTE — ED Notes (Signed)
Dc instructions reviewed with pt.  Pt verbalized understanding.  PT Dc.

## 2021-12-15 IMAGING — CT CT ANGIO CHEST
2 of 6 series · 18 of 36 positions shown · IV contrast (omnipaque)
Comparison: None.

CLINICAL DATA: Chest pain, cough and shortness of breath.

EXAM:
CT ANGIOGRAPHY CHEST WITH CONTRAST
TECHNIQUE: Multidetector CT imaging of the chest was performed using the
standard protocol during bolus administration of intravenous
contrast. Multiplanar CT image reconstructions and MIPs were
obtained to evaluate the vascular anatomy.
CONTRAST:  75mL OMNIPAQUE IOHEXOL 350 MG/ML SOLN

[Series 6: pe 2mm cor · coronal · 0.59mm/px · 1 of 151 slices shown]
[im 76/151  mediastinal]
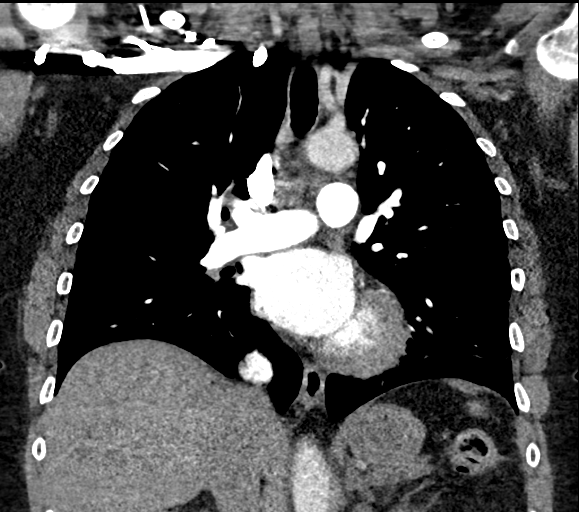

[Series 11: pe thins · axial · 0.85mm/px · z∈[-301,-34]mm · 17 of 425 slices shown]
[im 22/425  lung]
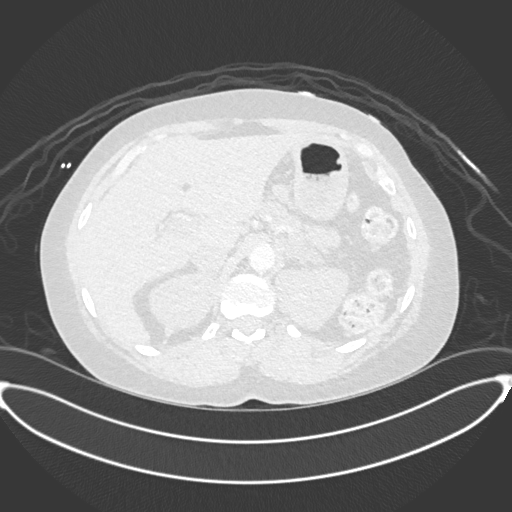
[im 43/425  mediastinal]
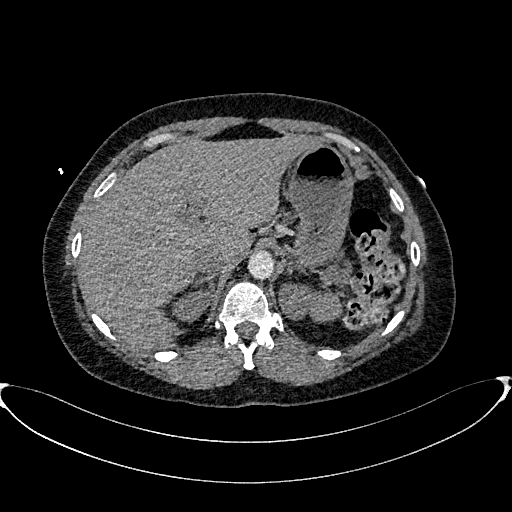
[im 64/425  lung]
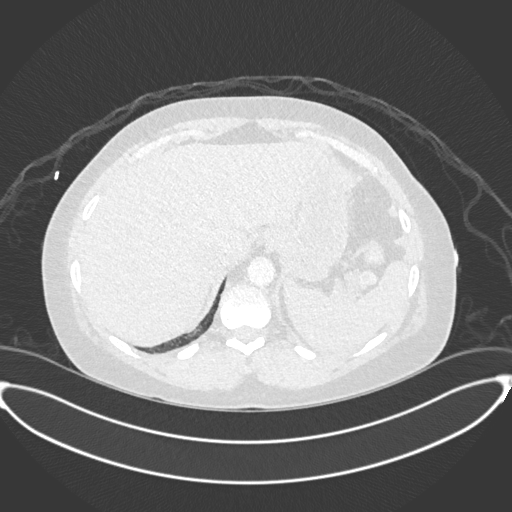
[im 85/425  mediastinal]
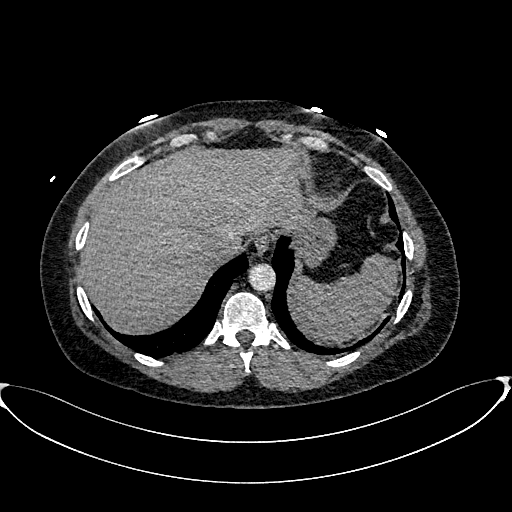
[im 128/425  lung]
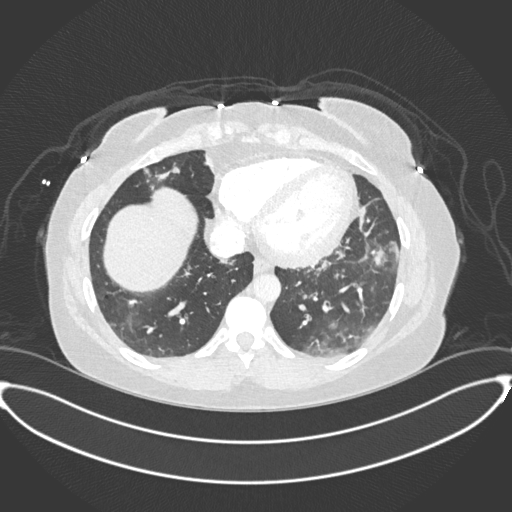
[im 149/425  mediastinal]
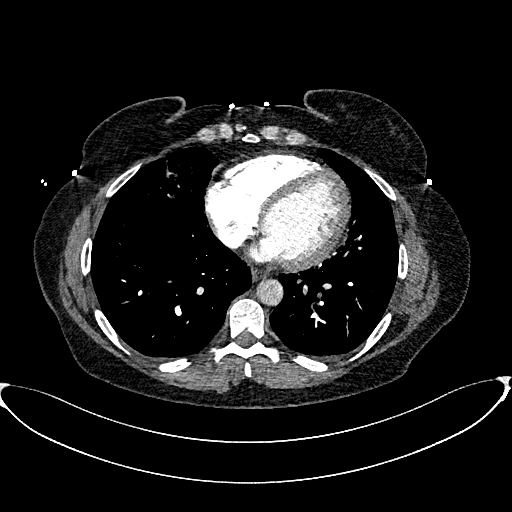
[im 170/425  lung]
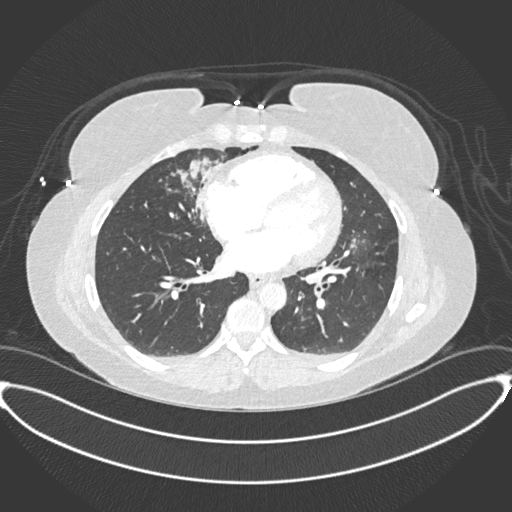
[im 191/425  mediastinal]
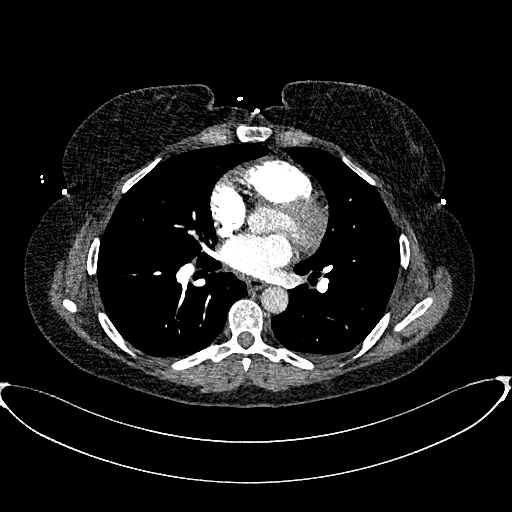
[im 213/425  lung]
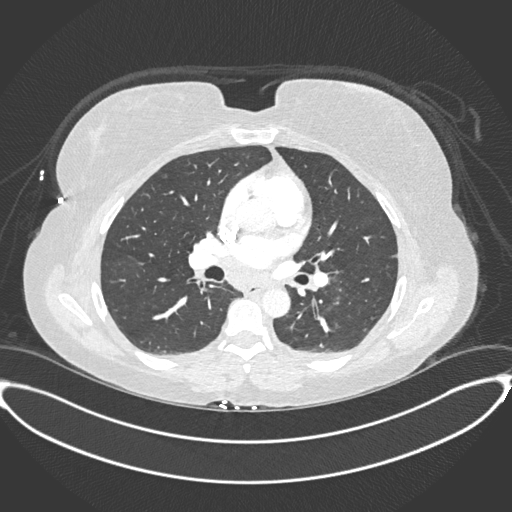
[im 234/425  mediastinal]
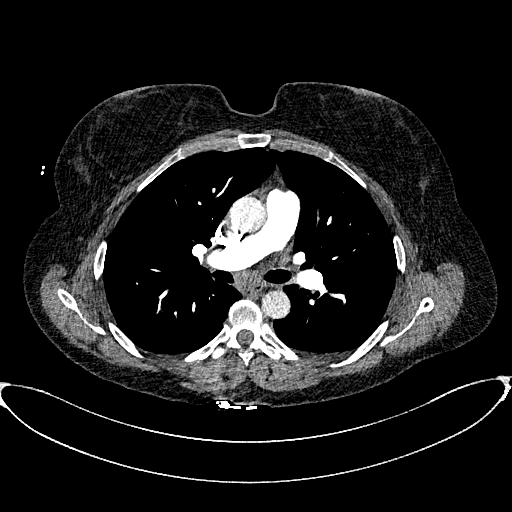
[im 255/425  lung]
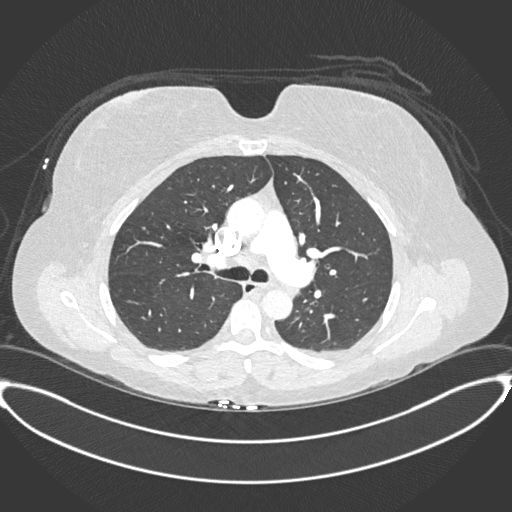
[im 276/425  mediastinal]
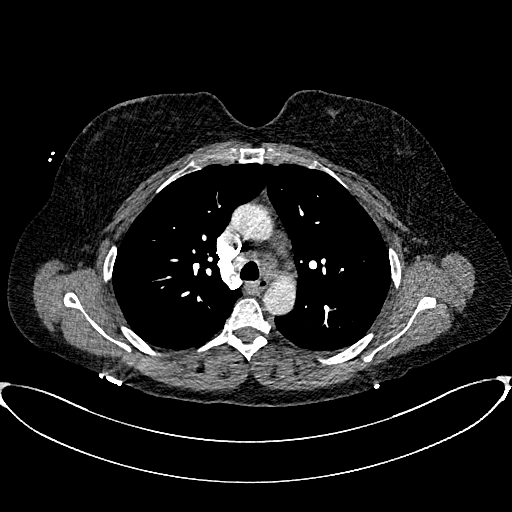
[im 297/425  lung]
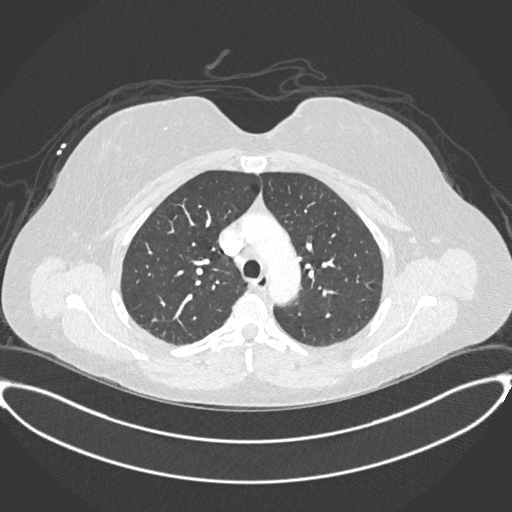
[im 340/425  mediastinal]
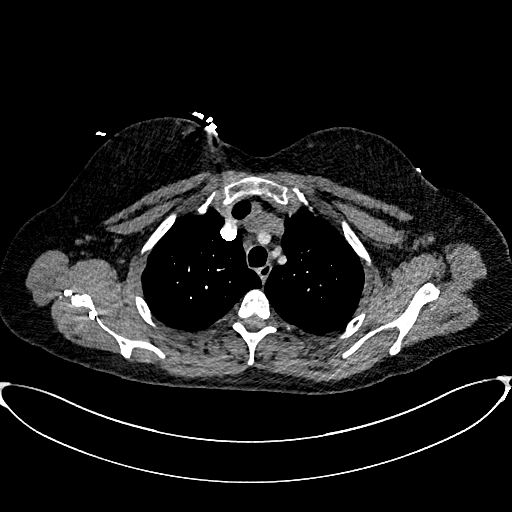
[im 361/425  lung]
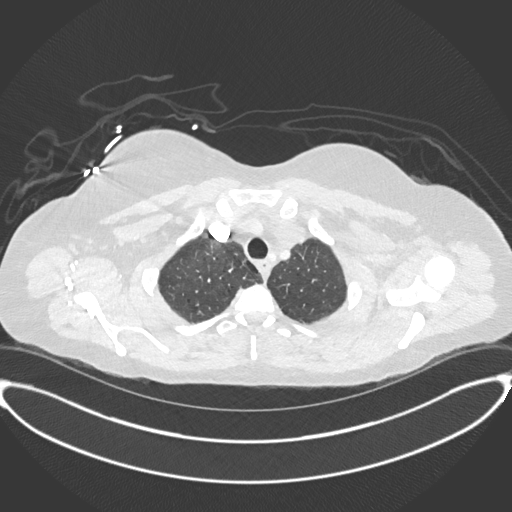
[im 382/425  mediastinal]
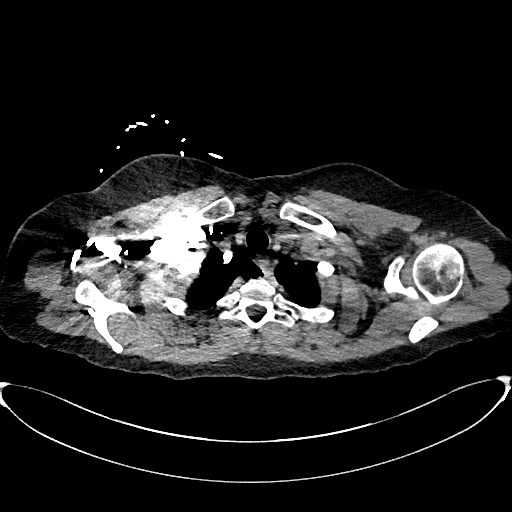
[im 403/425  lung]
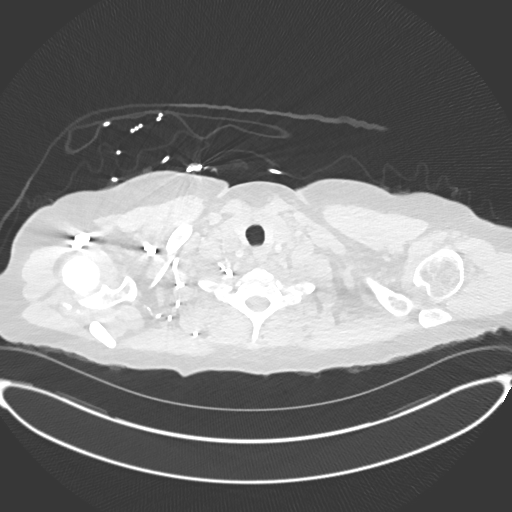

[18 of 36 positions shown; findings below may reference images not displayed]

FINDINGS: Cardiovascular: The heart is normal in size. No pericardial
effusion. The aorta is normal in caliber. No dissection. No
atherosclerotic calcifications. Scattered coronary artery
calcifications are noted.

The pulmonary arterial tree is well opacified. No filling defects to
suggest pulmonary embolism.

Mediastinum/Nodes: Mediastinal lymphadenopathy the. There are
enlarged AP window and subcarinal lymph nodes. The subcarinal node
measures a maximum of 2 cm. Small bilateral hilar nodes.

The esophagus is grossly normal.  The thyroid gland is unremarkable.

Lungs/Pleura: Mild emphysematous changes are noted. There are patchy
E bilateral pulmonary infiltrates most notably in the right middle
lobe and lingula. Peribronchial thickening and nodular airspace
opacities likely reflecting bronchopneumonia. More patchy
ground-glass opacity in both lower lobes. No pleural effusions. No
pulmonary edema.

Upper Abdomen: No significant upper abdominal findings. Small
low-attenuation liver lesion is likely a benign cyst.

Musculoskeletal: No breast masses, supraclavicular or axillary
adenopathy. Small scattered supraclavicular lymph nodes.

Review of the MIP images confirms the above findings.
IMPRESSION: 1. No CT findings for pulmonary embolism.
2. Normal thoracic aorta.
3. Patchy bilateral pulmonary infiltrates, most notably in the right
middle lobe and lingula.
4. Mediastinal and hilar lymphadenopathy, likely reactive, given the
lung findings. A follow-up chest CT with contrast may be helpful
after appropriate treatment to make sure this resolves.
5. Mild emphysematous changes.

## 2022-06-28 ENCOUNTER — Emergency Department (HOSPITAL_BASED_OUTPATIENT_CLINIC_OR_DEPARTMENT_OTHER): Payer: Medicaid - Out of State

## 2022-06-28 ENCOUNTER — Encounter (HOSPITAL_BASED_OUTPATIENT_CLINIC_OR_DEPARTMENT_OTHER): Payer: Self-pay | Admitting: Emergency Medicine

## 2022-06-28 ENCOUNTER — Other Ambulatory Visit: Payer: Self-pay

## 2022-06-28 ENCOUNTER — Emergency Department (HOSPITAL_BASED_OUTPATIENT_CLINIC_OR_DEPARTMENT_OTHER)
Admission: EM | Admit: 2022-06-28 | Discharge: 2022-06-28 | Disposition: A | Discharge status: Home / Self Care | Payer: Medicaid - Out of State | Attending: Emergency Medicine | Admitting: Emergency Medicine

## 2022-06-28 DIAGNOSIS — K6289 Other specified diseases of anus and rectum: Secondary | ICD-10-CM

## 2022-06-28 DIAGNOSIS — I10 Essential (primary) hypertension: Secondary | ICD-10-CM | POA: Diagnosis not present

## 2022-06-28 LAB — COMPREHENSIVE METABOLIC PANEL
ALT: 16 U/L (ref 0–44)
AST: 21 U/L (ref 15–41)
Albumin: 4.1 g/dL (ref 3.5–5.0)
Alkaline Phosphatase: 77 U/L (ref 38–126)
Anion gap: 7 (ref 5–15)
BUN: 15 mg/dL (ref 6–20)
CO2: 28 mmol/L (ref 22–32)
Calcium: 9.7 mg/dL (ref 8.9–10.3)
Chloride: 103 mmol/L (ref 98–111)
Creatinine, Ser: 1.03 mg/dL — ABNORMAL HIGH (ref 0.44–1.00)
GFR, Estimated: >60 mL/min (ref >60)
Glucose, Bld: 95 mg/dL (ref 70–99)
Potassium: 3.8 mmol/L (ref 3.5–5.1)
Sodium: 138 mmol/L (ref 135–145)
Total Bilirubin: 0.2 mg/dL — ABNORMAL LOW (ref 0.3–1.2)
Total Protein: 7.1 g/dL (ref 6.5–8.1)

## 2022-06-28 LAB — CBC WITH DIFFERENTIAL/PLATELET
Abs Immature Granulocytes: 0.01 10*3/uL (ref 0.00–0.07)
Basophils Absolute: 0 10*3/uL (ref 0.0–0.1)
Basophils Relative: 0 %
Eosinophils Absolute: 0.1 10*3/uL (ref 0.0–0.5)
Eosinophils Relative: 3 %
HCT: 35 % — ABNORMAL LOW (ref 36.0–46.0)
Hemoglobin: 11.5 g/dL — ABNORMAL LOW (ref 12.0–15.0)
Immature Granulocytes: 0 %
Lymphocytes Relative: 28 %
Lymphs Abs: 1.4 10*3/uL (ref 0.7–4.0)
MCH: 30.1 pg (ref 26.0–34.0)
MCHC: 32.9 g/dL (ref 30.0–36.0)
MCV: 91.6 fL (ref 80.0–100.0)
Monocytes Absolute: 0.5 10*3/uL (ref 0.1–1.0)
Monocytes Relative: 10 %
Neutro Abs: 3 10*3/uL (ref 1.7–7.7)
Neutrophils Relative %: 59 %
Platelets: 154 10*3/uL (ref 150–400)
RBC: 3.82 MIL/uL — ABNORMAL LOW (ref 3.87–5.11)
RDW: 13.9 % (ref 11.5–15.5)
WBC: 5 10*3/uL (ref 4.0–10.5)
nRBC: 0 % (ref 0.0–0.2)

## 2022-06-28 MED ORDER — METOPROLOL SUCCINATE ER 25 MG PO TB24: 25.0000 mg | ORAL_TABLET | Freq: Every day | ORAL | 0 refills | Status: DC

## 2022-06-28 MED ORDER — IOHEXOL 300 MG/ML  SOLN
100.0000 mL | Freq: Once | INTRAMUSCULAR | Status: AC | PRN
  Administered 2022-06-28: 100 mL via INTRAVENOUS

## 2022-06-28 MED ORDER — AMLODIPINE BESYLATE 5 MG PO TABS: 10.0000 mg | ORAL_TABLET | Freq: Every day | ORAL | 0 refills | Status: DC

## 2022-06-28 NOTE — ED Provider Notes (Addendum)
[facility] EMERGENCY DEPT Provider Note   CSN: [id number] Arrival date & time: 06/28/22  1846     History  Chief Complaint  Patient presents with   Rectal Pain    Bonnie Douglas is a 56 y.o. female.  Patient is a 56 year old female who presents with rectal pain.  She has a history of what she says is anal cancer in 2019.  She was treated in [place].  She has a GI doctor in [place].  She is currently here visiting her son.  She has had about a 1 week history of pain in her left rectal area.  She is concerned that she has a recurrence of the cancer.  She is also had some intermittent blood when she wipes.  No blood in the toilet.  She has had some nausea but no vomiting.  No abdominal pain.       Home Medications Prior to Admission medications   Medication Sig Start Date End Date Taking? Authorizing Provider  metoprolol succinate (TOPROL-XL) 25 MG 24 hr tablet Take 1 tablet (25 mg total) by mouth daily. 06/28/22  Yes Malvin Johns, MD  amLODipine (NORVASC) 5 MG tablet Take 2 tablets (10 mg total) by mouth daily. 06/28/22   Malvin Johns, MD  azithromycin (ZITHROMAX) 250 MG tablet Take 1 tablet (250 mg total) by mouth daily. 04/07/21   Carlisle Cater, PA-C  benzonatate (TESSALON) 100 MG capsule Take 1 capsule (100 mg total) by mouth every 8 (eight) hours. 04/07/21   Carlisle Cater, PA-C      Allergies    Biaxin [clarithromycin] and Vicodin [hydrocodone-acetaminophen]    Review of Systems   Review of Systems  Constitutional:  Negative for chills, diaphoresis, fatigue and fever.  HENT:  Negative for congestion, rhinorrhea and sneezing.   Eyes: Negative.   Respiratory:  Negative for cough, chest tightness and shortness of breath.   Cardiovascular:  Negative for chest pain and leg swelling.  Gastrointestinal:  Positive for blood in stool and rectal pain. Negative for abdominal pain, diarrhea, nausea and vomiting.  Genitourinary:  Negative for difficulty urinating, flank  pain, frequency and hematuria.  Musculoskeletal:  Negative for arthralgias and back pain.  Skin:  Negative for rash.  Neurological:  Negative for dizziness, speech difficulty, weakness, numbness and headaches.    Physical Exam Updated Vital Signs BP (!) 154/114 (BP Location: Right Arm)   Pulse 67   Temp (!) 97.5 F (36.4 C) (Oral)   Resp 18   SpO2 99%  Physical Exam Constitutional:      Appearance: She is well-developed.  HENT:     Head: Normocephalic and atraumatic.  Eyes:     Pupils: Pupils are equal, round, and reactive to light.  Cardiovascular:     Rate and Rhythm: Normal rate and regular rhythm.     Heart sounds: Normal heart sounds.  Pulmonary:     Effort: Pulmonary effort is normal. No respiratory distress.     Breath sounds: Normal breath sounds. No wheezing or rales.  Chest:     Chest wall: No tenderness.  Abdominal:     General: Bowel sounds are normal.     Palpations: Abdomen is soft.     Tenderness: There is no abdominal tenderness. There is no guarding or rebound.  Genitourinary:    Comments: Rectal exam shows brown stool.  No obvious masses palpated.  She has some tenderness in the internal rectal area. Musculoskeletal:        General: Normal  range of motion.     Cervical back: Normal range of motion and neck supple.  Lymphadenopathy:     Cervical: No cervical adenopathy.  Skin:    General: Skin is warm and dry.     Findings: No rash.  Neurological:     Mental Status: She is alert and oriented to person, place, and time.     ED Results / Procedures / Treatments   Labs (all labs ordered are listed, but only abnormal results are displayed) Labs Reviewed  COMPREHENSIVE METABOLIC PANEL - Abnormal; Notable for the following components:      Result Value   Creatinine, Ser 1.03 (*)    Total Bilirubin 0.2 (*)    All other components within normal limits  CBC WITH DIFFERENTIAL/PLATELET - Abnormal; Notable for the following components:   RBC 3.82 (*)     Hemoglobin 11.5 (*)    HCT 35.0 (*)    All other components within normal limits    EKG None  Radiology CT PELVIS W CONTRAST  Result Date: 06/28/2022 CLINICAL DATA:  Perianal abscess or fistula suspected rectal pain, hx of anal cancer EXAM: CT PELVIS WITH CONTRAST TECHNIQUE: Multidetector CT imaging of the pelvis was performed using the standard protocol following the bolus administration of intravenous contrast. RADIATION DOSE REDUCTION: This exam was performed according to the departmental dose-optimization program which includes automated exposure control, adjustment of the mA and/or kV according to patient size and/or use of iterative reconstruction technique. CONTRAST:  132m OMNIPAQUE IOHEXOL 300 MG/ML  SOLN COMPARISON:  Abdominopelvic CT 03/29/2021 FINDINGS: Urinary Tract: Minimally distended urinary bladder, not well assessed on the current exam. There is no obvious bladder wall thickening. Bowel: There is no perirectal/perianal fluid collection. There is no obvious hiatal rectal mass in this patient with history of rectal cancer, although MRI would be more sensitive. There is no obstruction or inflammation of pelvic bowel loops. Normal appendix visualized. Vascular/Lymphatic: Aortic atherosclerosis. No acute vascular findings. There is small bilateral inguinal lymph nodes are not enlarged by size criteria. No pelvic adenopathy. Reproductive: Uterus is not well-defined on the current exam. There is no adnexal mass. Other: No pelvic fluid collection or free fluid. No perineal abscess. No free air in the pelvis. There is a small fat containing umbilical hernia. Musculoskeletal: L5-S1 degenerative disc disease. There are no acute or suspicious osseous abnormalities. IMPRESSION: 1. No perirectal/perianal fluid collection or abscess. 2. No obvious anorectal mass mass in this patient with history of anal cancer, although MRI would be more sensitive. Aortic Atherosclerosis (ICD10-I70.0). Electronically  Signed   By: MKeith RakeM.D.   On: 06/28/2022 21:42    Procedures Procedures    Medications Ordered in ED Medications  iohexol (OMNIPAQUE) 300 MG/ML solution 100 mL (100 mLs Intravenous Contrast Given 06/28/22 2126)    ED Course/ Medical Decision Making/ A&P                           Medical Decision Making Amount and/or Complexity of Data Reviewed Labs: ordered. Radiology: ordered.  Risk Prescription drug management.   Patient presents with rectal pain.  No visualized masses were noted.  CT scan was performed which shows no evidence of perirectal abscess or mass.  Her labs are reviewed and are nonconcerning.  Findings were discussed with the patient.  She was advised that she [place]  [place] on Tuesday to get  an appointment.  She potentially will need a colonoscopy to take a look in there.  Return precautions were given.  She also states that she is out of her blood pressure medications.  She was given a 30-day supply of her amlodipine 10 mg tablets and Toprol XL 25 mg tablets.  She was advised that she needs to have her blood pressure rechecked when she gets back to [place].  Final Clinical Impression(s) / ED Diagnoses Final diagnoses:  Rectal pain  Essential hypertension    Rx / DC Orders ED Discharge Orders          Ordered    amLODipine (NORVASC) 5 MG tablet  Daily        06/28/22 2213    metoprolol succinate (TOPROL-XL) 25 MG 24 hr tablet  Daily        06/28/22 2213              Malvin Johns, MD 06/28/22 2203    Malvin Johns, MD 06/28/22 2214

## 2022-06-28 NOTE — Discharge Instructions (Signed)
Follow-up with your gastroenterologist.  Return to the emergency room if you have any worsening symptoms.  Your blood pressure was elevated and needs to be rechecked by your primary care doctor.

## 2022-06-28 NOTE — ED Triage Notes (Signed)
Hx of rectal CA in 2019/  Now feels rectal pain same as when she was diagnosis with CA. Started about a week ago. Some diarrhea and "gassy feel"  Blood after wiping. Bright red.  Took home pain reliever around 3:30pm. Not helping for this pain

## 2022-08-30 ENCOUNTER — Emergency Department (HOSPITAL_COMMUNITY)
Admission: EM | Admit: 2022-08-30 | Discharge: 2022-08-30 | Disposition: A | Discharge status: Hospice | Payer: No Typology Code available for payment source | Attending: Emergency Medicine | Admitting: Emergency Medicine

## 2022-08-30 ENCOUNTER — Other Ambulatory Visit: Payer: Self-pay

## 2022-08-30 ENCOUNTER — Encounter (HOSPITAL_COMMUNITY): Payer: Self-pay

## 2022-08-30 DIAGNOSIS — Z79899 Other long term (current) drug therapy: Secondary | ICD-10-CM | POA: Diagnosis not present

## 2022-08-30 DIAGNOSIS — J3489 Other specified disorders of nose and nasal sinuses: Secondary | ICD-10-CM | POA: Diagnosis not present

## 2022-08-30 DIAGNOSIS — F1193 Opioid use, unspecified with withdrawal: Secondary | ICD-10-CM | POA: Diagnosis not present

## 2022-08-30 DIAGNOSIS — I4589 Other specified conduction disorders: Secondary | ICD-10-CM | POA: Insufficient documentation

## 2022-08-30 DIAGNOSIS — R451 Restlessness and agitation: Secondary | ICD-10-CM | POA: Diagnosis not present

## 2022-08-30 DIAGNOSIS — E876 Hypokalemia: Secondary | ICD-10-CM | POA: Insufficient documentation

## 2022-08-30 DIAGNOSIS — R112 Nausea with vomiting, unspecified: Secondary | ICD-10-CM | POA: Diagnosis present

## 2022-08-30 DIAGNOSIS — I10 Essential (primary) hypertension: Secondary | ICD-10-CM | POA: Diagnosis not present

## 2022-08-30 DIAGNOSIS — F419 Anxiety disorder, unspecified: Secondary | ICD-10-CM | POA: Diagnosis not present

## 2022-08-30 DIAGNOSIS — R9431 Abnormal electrocardiogram [ECG] [EKG]: Secondary | ICD-10-CM

## 2022-08-30 LAB — COMPREHENSIVE METABOLIC PANEL
ALT: 17 U/L (ref 0–44)
AST: 22 U/L (ref 15–41)
Albumin: 3.8 g/dL (ref 3.5–5.0)
Alkaline Phosphatase: 82 U/L (ref 38–126)
Anion gap: 13 (ref 5–15)
BUN: 10 mg/dL (ref 6–20)
CO2: 21 mmol/L — ABNORMAL LOW (ref 22–32)
Calcium: 9.5 mg/dL (ref 8.9–10.3)
Chloride: 101 mmol/L (ref 98–111)
Creatinine, Ser: 1.14 mg/dL — ABNORMAL HIGH (ref 0.44–1.00)
GFR, Estimated: 56 mL/min — ABNORMAL LOW (ref >60)
Glucose, Bld: 111 mg/dL — ABNORMAL HIGH (ref 70–99)
Potassium: 3 mmol/L — ABNORMAL LOW (ref 3.5–5.1)
Sodium: 135 mmol/L (ref 135–145)
Total Bilirubin: 0.6 mg/dL (ref 0.3–1.2)
Total Protein: 7.5 g/dL (ref 6.5–8.1)

## 2022-08-30 LAB — CBC
HCT: 40.2 % (ref 36.0–46.0)
Hemoglobin: 13.7 g/dL (ref 12.0–15.0)
MCH: 30.5 pg (ref 26.0–34.0)
MCHC: 34.1 g/dL (ref 30.0–36.0)
MCV: 89.5 fL (ref 80.0–100.0)
Platelets: 178 10*3/uL (ref 150–400)
RBC: 4.49 MIL/uL (ref 3.87–5.11)
RDW: 13.2 % (ref 11.5–15.5)
WBC: 8.5 10*3/uL (ref 4.0–10.5)
nRBC: 0 % (ref 0.0–0.2)

## 2022-08-30 LAB — DIFFERENTIAL
Abs Immature Granulocytes: 0.02 10*3/uL (ref 0.00–0.07)
Basophils Absolute: 0 10*3/uL (ref 0.0–0.1)
Basophils Relative: 1 %
Eosinophils Absolute: 0.2 10*3/uL (ref 0.0–0.5)
Eosinophils Relative: 2 %
Immature Granulocytes: 0 %
Lymphocytes Relative: 30 %
Lymphs Abs: 2.6 10*3/uL (ref 0.7–4.0)
Monocytes Absolute: 0.6 10*3/uL (ref 0.1–1.0)
Monocytes Relative: 7 %
Neutro Abs: 5.2 10*3/uL (ref 1.7–7.7)
Neutrophils Relative %: 60 %

## 2022-08-30 LAB — MAGNESIUM: Magnesium: 1.8 mg/dL (ref 1.7–2.4)

## 2022-08-30 LAB — LIPASE, BLOOD: Lipase: 28 U/L (ref 11–51)

## 2022-08-30 MED ORDER — LACTATED RINGERS IV BOLUS
1000.0000 mL | Freq: Once | INTRAVENOUS | Status: AC
  Administered 2022-08-30: 1000 mL via INTRAVENOUS

## 2022-08-30 MED ORDER — CLONIDINE HCL 0.1 MG PO TABS
0.1000 mg | ORAL_TABLET | Freq: Once | ORAL | Status: AC
  Administered 2022-08-30: 0.1 mg via ORAL
  Filled 2022-08-30: qty 1

## 2022-08-30 MED ORDER — LORAZEPAM 2 MG/ML IJ SOLN
1.0000 mg | Freq: Once | INTRAMUSCULAR | Status: AC
  Administered 2022-08-30: 1 mg via INTRAVENOUS
  Filled 2022-08-30: qty 1

## 2022-08-30 MED ORDER — IBUPROFEN 400 MG PO TABS
600.0000 mg | ORAL_TABLET | Freq: Once | ORAL | Status: AC
  Administered 2022-08-30: 600 mg via ORAL
  Filled 2022-08-30: qty 1

## 2022-08-30 MED ORDER — MORPHINE SULFATE (PF) 4 MG/ML IV SOLN
4.0000 mg | Freq: Once | INTRAVENOUS | Status: AC
  Administered 2022-08-30: 4 mg via INTRAVENOUS
  Filled 2022-08-30: qty 1

## 2022-08-30 MED ORDER — LORAZEPAM 2 MG/ML IJ SOLN
1.0000 mg | Freq: Once | INTRAMUSCULAR | Status: AC
  Administered 2022-08-30: 1 mg via INTRAMUSCULAR
  Filled 2022-08-30: qty 1

## 2022-08-30 MED ORDER — ACETAMINOPHEN 325 MG PO TABS
650.0000 mg | ORAL_TABLET | Freq: Once | ORAL | Status: AC
  Administered 2022-08-30: 650 mg via ORAL
  Filled 2022-08-30: qty 2

## 2022-08-30 NOTE — ED Provider Notes (Signed)
[facility] EMERGENCY DEPARTMENT Provider Note   CSN: [id number] Arrival date & time: 08/30/22  8563     History  Chief Complaint  Patient presents with   Abdominal Pain   Emesis    Bonnie Douglas is a 56 y.o. female.  Patient is a 56 year old female with a past medical history of hypertension and opioid use disorder presenting to the emergency department with nausea, vomiting and diarrhea.  Patient states that she previously smoked heroin daily and states that last night she took Suboxone which she is concerned her into withdrawal.  She states that since then she has been nauseous, vomiting and having diarrhea.  She states she has diffuse abdominal pain.  She states that she has had copious rhinorrhea and feels diaphoretic.  She denies any fevers or chills.  She denies any chest pain, dysuria or hematuria.  She states that she used a marijuana edible a few days ago but denies any other regular drug or alcohol use.  He denies any prior abdominal surgeries.   The history is provided by the patient.       Home Medications Prior to Admission medications   Medication Sig Start Date End Date Taking? Authorizing Provider  amLODipine (NORVASC) 5 MG tablet Take 2 tablets (10 mg total) by mouth daily. 06/28/22   Malvin Johns, MD  azithromycin (ZITHROMAX) 250 MG tablet Take 1 tablet (250 mg total) by mouth daily. 04/07/21   Carlisle Cater, PA-C  benzonatate (TESSALON) 100 MG capsule Take 1 capsule (100 mg total) by mouth every 8 (eight) hours. 04/07/21   Carlisle Cater, PA-C  metoprolol succinate (TOPROL-XL) 25 MG 24 hr tablet Take 1 tablet (25 mg total) by mouth daily. 06/28/22   Malvin Johns, MD      Allergies    Biaxin [clarithromycin] and Vicodin [hydrocodone-acetaminophen]    Review of Systems   Review of Systems  Physical Exam Updated Vital Signs BP (!) 158/103   Pulse 89   Temp 98.7 F (37.1 C) (Oral)   Resp (!) 26   Ht '5\' 7"'$  (1.702 m)   Wt 77.1 kg   SpO2  97%   BMI 26.63 kg/m  Physical Exam Vitals and nursing note reviewed.  Constitutional:      Comments: Actively vomiting on exam requesting nausea medication  HENT:     Head: Normocephalic and atraumatic.     Nose: Rhinorrhea (Copious, clear) present.     Mouth/Throat:     Mouth: Mucous membranes are moist.     Pharynx: Oropharynx is clear.  Eyes:     Extraocular Movements: Extraocular movements intact.     Conjunctiva/sclera: Conjunctivae normal.     Pupils: Pupils are equal, round, and reactive to light.  Cardiovascular:     Rate and Rhythm: Normal rate and regular rhythm.     Pulses: Normal pulses.     Heart sounds: Normal heart sounds.  Pulmonary:     Effort: Pulmonary effort is normal.     Breath sounds: Normal breath sounds.  Abdominal:     General: Abdomen is flat.     Palpations: Abdomen is soft.     Tenderness: There is no abdominal tenderness. There is no right CVA tenderness, left CVA tenderness, guarding or rebound.  Musculoskeletal:        General: Normal range of motion.     Cervical back: Normal range of motion and neck supple.     Right lower leg: No edema.     Left  lower leg: No edema.  Skin:    General: Skin is warm.     Comments: Mildly sweaty on the palms  Neurological:     General: No focal deficit present.     Mental Status: She is alert and oriented to person, place, and time.  Psychiatric:     Comments: Anxious appearing and agitated moving around throughout the room vomiting     ED Results / Procedures / Treatments   Labs (all labs ordered are listed, but only abnormal results are displayed) Labs Reviewed  COMPREHENSIVE METABOLIC PANEL - Abnormal; Notable for the following components:      Result Value   Potassium 3.0 (*)    CO2 21 (*)    Glucose, Bld 111 (*)    Creatinine, Ser 1.14 (*)    GFR, Estimated 56 (*)    All other components within normal limits  CBC  DIFFERENTIAL  LIPASE, BLOOD  MAGNESIUM  CBC WITH DIFFERENTIAL/PLATELET     EKG EKG Interpretation  Date/Time:  Saturday August 30 2022 08:20:20 EDT Ventricular Rate:  92 PR Interval:  150 QRS Duration: 77 QT Interval:  454 QTC Calculation: 562 R Axis:   80 Text Interpretation: Sinus rhythm Borderline repolarization abnormality Prolonged QT interval increased compared to prior EKG Confirmed by Leanord Asal ([id number]) on 08/30/2022 8:23:54 AM  Radiology No results found.  Procedures Procedures    Medications Ordered in ED Medications  LORazepam (ATIVAN) injection 1 mg (1 mg Intramuscular Given 08/30/22 0854)  lactated ringers bolus 1,000 mL (0 mLs Intravenous Stopped 08/30/22 1310)  LORazepam (ATIVAN) injection 1 mg (1 mg Intravenous Given 08/30/22 0958)  cloNIDine (CATAPRES) tablet 0.1 mg (0.1 mg Oral Given 08/30/22 1000)  morphine (PF) 4 MG/ML injection 4 mg (4 mg Intravenous Given 08/30/22 0956)  cloNIDine (CATAPRES) tablet 0.1 mg (0.1 mg Oral Given 08/30/22 1219)  acetaminophen (TYLENOL) tablet 650 mg (650 mg Oral Given 08/30/22 1219)  ibuprofen (ADVIL) tablet 600 mg (600 mg Oral Given 08/30/22 1219)    ED Course/ Medical Decision Making/ A&P Clinical Course as of 08/30/22 1316  Sat Aug 30, 2022  0928 Patient had no significant improvement with ativan for nausea and will be given a second dose in addition to clonidine for her opioid withdrawal. We will assess if she is amenable to treatment with opioids to treat withdrawal symptoms. [VK]  1130 Upon reassessment, the patient is asleep in bed resting comfortably.  She is easily arousable to verbal stimulation and reports significant improvement of her symptoms.  Her COWS is now 1 for mildly elevated heart rate. [VK]  1213 Patient is now agitated in bed complaining of diffuse body aches and anxiety asking for additional medication.  She will be given Tylenol and Motrin for her body aches and was given additional dose of clonidine.  Her labs hemolyzed and will need to be redrawn at this time. [VK]  1311  Patient eloped prior to completion of her evaluation. [VK]    Clinical Course User Index [VK] Kemper Durie, DO                           Medical Decision Making This patient presents to the ED with chief complaint(s) of nausea, vomiting and diarrhea with pertinent past medical history of hypertension, opioid use disorder which further complicates the presenting complaint. The complaint involves an extensive differential diagnosis and also carries with it a high risk of complications and morbidity.  The differential diagnosis includes opioid withdrawal, pancreatitis, hepatitis, gastroenteritis, gastritis, GERD, atypical ACS, electrolyte abnormality, dehydration, intra-abdominal infection unlikely as she has no point abdominal tenderness, SBO unlikely as she is having bowel movements and is nondistended with no prior abdominal surgeries, also considering cannabinoid hyperemesis  Additional history obtained: Additional history obtained from N/A Records reviewed previous ED records  ED Course and Reassessment: On patient's arrival to the emergency department she is agitated appearing actively vomiting on exam with a COWS score of 17, concerning for opioid withdrawal.  EKG was performed that did show concern for QT prolongation with a QTc of 562, so she will be treated with Ativan for her nausea and anxiety.  Labs will be performed and fluids will be given to assess symptomatically for other causes of her symptoms and she will be closely reassessed.  Independent labs interpretation:  The following labs were independently interpreted: Mild hypokalemia, otherwise within normal range  Independent visualization of imaging: N/A  Consultation: - Consulted or discussed management/test interpretation w/ external professional: N/A  Consideration for admission or further workup: Considered further work-up with treatment of her hypokalemia is likely contributing to her prolonged QT, however  patient eloped prior to completion of evaluation Social Determinants of health: Substance use disorder    Amount and/or Complexity of Data Reviewed Labs: ordered.  Risk OTC drugs. Prescription drug management.          Final Clinical Impression(s) / ED Diagnoses Final diagnoses:  Opioid withdrawal ([place])  Hypokalemia  Prolonged QT interval    Rx / DC Orders ED Discharge Orders     None         Kemper Durie, DO 08/30/22 1316

## 2022-08-30 NOTE — ED Notes (Signed)
Patient removed her own IV and left AMA, advised to stay, she wanted to leave, has been loud, yelling, fussing and cussing since arrival and rude to staff.  Maylon Peppers, DO notified

## 2022-08-30 NOTE — ED Triage Notes (Signed)
Patient states she has been vomiting and having abdominal pain for 3 days, states she did take a Subutex not prescribed to her, but has not been compliant with other medications

## 2022-12-25 ENCOUNTER — Emergency Department (HOSPITAL_COMMUNITY)
Admission: EM | Admit: 2022-12-25 | Discharge: 2022-12-25 | Disposition: A | Discharge status: Home / Self Care | Payer: No Typology Code available for payment source | Source: Home / Self Care | Attending: Emergency Medicine | Admitting: Emergency Medicine

## 2022-12-25 ENCOUNTER — Emergency Department (HOSPITAL_COMMUNITY): Payer: No Typology Code available for payment source

## 2022-12-25 ENCOUNTER — Encounter (HOSPITAL_COMMUNITY): Payer: Self-pay

## 2022-12-25 ENCOUNTER — Other Ambulatory Visit: Payer: Self-pay

## 2022-12-25 DIAGNOSIS — J101 Influenza due to other identified influenza virus with other respiratory manifestations: Secondary | ICD-10-CM | POA: Insufficient documentation

## 2022-12-25 DIAGNOSIS — Z760 Encounter for issue of repeat prescription: Secondary | ICD-10-CM | POA: Insufficient documentation

## 2022-12-25 DIAGNOSIS — A4189 Other specified sepsis: Secondary | ICD-10-CM | POA: Diagnosis not present

## 2022-12-25 DIAGNOSIS — Z1152 Encounter for screening for COVID-19: Secondary | ICD-10-CM | POA: Insufficient documentation

## 2022-12-25 DIAGNOSIS — E860 Dehydration: Secondary | ICD-10-CM | POA: Insufficient documentation

## 2022-12-25 DIAGNOSIS — D649 Anemia, unspecified: Secondary | ICD-10-CM | POA: Insufficient documentation

## 2022-12-25 LAB — MAGNESIUM: Magnesium: 1.8 mg/dL (ref 1.7–2.4)

## 2022-12-25 LAB — CBC WITH DIFFERENTIAL/PLATELET
Abs Immature Granulocytes: 0.03 10*3/uL (ref 0.00–0.07)
Basophils Absolute: 0 10*3/uL (ref 0.0–0.1)
Basophils Relative: 0 %
Eosinophils Absolute: 0 10*3/uL (ref 0.0–0.5)
Eosinophils Relative: 0 %
HCT: 28.5 % — ABNORMAL LOW (ref 36.0–46.0)
Hemoglobin: 8.9 g/dL — ABNORMAL LOW (ref 12.0–15.0)
Immature Granulocytes: 1 %
Lymphocytes Relative: 7 %
Lymphs Abs: 0.4 10*3/uL — ABNORMAL LOW (ref 0.7–4.0)
MCH: 30.8 pg (ref 26.0–34.0)
MCHC: 31.2 g/dL (ref 30.0–36.0)
MCV: 98.6 fL (ref 80.0–100.0)
Monocytes Absolute: 0.4 10*3/uL (ref 0.1–1.0)
Monocytes Relative: 6 %
Neutro Abs: 5 10*3/uL (ref 1.7–7.7)
Neutrophils Relative %: 86 %
Platelets: 85 10*3/uL — ABNORMAL LOW (ref 150–400)
RBC: 2.89 MIL/uL — ABNORMAL LOW (ref 3.87–5.11)
RDW: 15 % (ref 11.5–15.5)
WBC: 5.8 10*3/uL (ref 4.0–10.5)
nRBC: 0 % (ref 0.0–0.2)

## 2022-12-25 LAB — COMPREHENSIVE METABOLIC PANEL
ALT: 18 U/L (ref 0–44)
AST: 24 U/L (ref 15–41)
Albumin: 3.5 g/dL (ref 3.5–5.0)
Alkaline Phosphatase: 88 U/L (ref 38–126)
Anion gap: 13 (ref 5–15)
BUN: 13 mg/dL (ref 6–20)
CO2: 24 mmol/L (ref 22–32)
Calcium: 9 mg/dL (ref 8.9–10.3)
Chloride: 97 mmol/L — ABNORMAL LOW (ref 98–111)
Creatinine, Ser: 1.05 mg/dL — ABNORMAL HIGH (ref 0.44–1.00)
GFR, Estimated: >60 mL/min (ref >60)
Glucose, Bld: 105 mg/dL — ABNORMAL HIGH (ref 70–99)
Potassium: 3.6 mmol/L (ref 3.5–5.1)
Sodium: 134 mmol/L — ABNORMAL LOW (ref 135–145)
Total Bilirubin: 0.3 mg/dL (ref 0.3–1.2)
Total Protein: 7.2 g/dL (ref 6.5–8.1)

## 2022-12-25 LAB — RETICULOCYTES
Immature Retic Fract: 10 % (ref 2.3–15.9)
RBC.: 3.92 MIL/uL (ref 3.87–5.11)
Retic Count, Absolute: 56.8 10*3/uL (ref 19.0–186.0)
Retic Ct Pct: 1.5 % (ref 0.4–3.1)

## 2022-12-25 LAB — POC OCCULT BLOOD, ED: Fecal Occult Bld: NEGATIVE

## 2022-12-25 LAB — BASIC METABOLIC PANEL
Anion gap: 15 (ref 5–15)
BUN: 13 mg/dL (ref 6–20)
CO2: 21 mmol/L — ABNORMAL LOW (ref 22–32)
Calcium: 9 mg/dL (ref 8.9–10.3)
Chloride: 98 mmol/L (ref 98–111)
Creatinine, Ser: 1.03 mg/dL — ABNORMAL HIGH (ref 0.44–1.00)
GFR, Estimated: >60 mL/min (ref >60)
Glucose, Bld: 107 mg/dL — ABNORMAL HIGH (ref 70–99)
Potassium: 3.7 mmol/L (ref 3.5–5.1)
Sodium: 134 mmol/L — ABNORMAL LOW (ref 135–145)

## 2022-12-25 LAB — RESP PANEL BY RT-PCR (RSV, FLU A&B, COVID)  RVPGX2
Influenza A by PCR: POSITIVE — AB
Influenza B by PCR: NEGATIVE
Resp Syncytial Virus by PCR: NEGATIVE
SARS Coronavirus 2 by RT PCR: NEGATIVE

## 2022-12-25 LAB — FERRITIN: Ferritin: 74 ng/mL (ref 11–307)

## 2022-12-25 LAB — IRON AND TIBC
Iron: 29 ug/dL (ref 28–170)
Saturation Ratios: 7 % — ABNORMAL LOW (ref 10.4–31.8)
TIBC: 414 ug/dL (ref 250–450)
UIBC: 385 ug/dL

## 2022-12-25 LAB — BRAIN NATRIURETIC PEPTIDE: B Natriuretic Peptide: 77 pg/mL (ref 0.0–100.0)

## 2022-12-25 LAB — VITAMIN B12: Vitamin B-12: 394 pg/mL (ref 180–914)

## 2022-12-25 LAB — FOLATE: Folate: 21.6 ng/mL (ref >5.9)

## 2022-12-25 MED ORDER — METHOCARBAMOL 500 MG PO TABS: 500.0000 mg | ORAL_TABLET | Freq: Two times a day (BID) | ORAL | 0 refills | Status: DC

## 2022-12-25 MED ORDER — IBUPROFEN 600 MG PO TABS: 600.0000 mg | ORAL_TABLET | Freq: Four times a day (QID) | ORAL | 0 refills | Status: AC | PRN

## 2022-12-25 MED ORDER — IBUPROFEN 400 MG PO TABS
600.0000 mg | ORAL_TABLET | Freq: Once | ORAL | Status: AC
  Administered 2022-12-25: 600 mg via ORAL
  Filled 2022-12-25: qty 1

## 2022-12-25 MED ORDER — KETOROLAC TROMETHAMINE 15 MG/ML IJ SOLN
15.0000 mg | Freq: Once | INTRAMUSCULAR | Status: AC
  Administered 2022-12-25: 15 mg via INTRAVENOUS
  Filled 2022-12-25: qty 1

## 2022-12-25 MED ORDER — METOPROLOL SUCCINATE ER 25 MG PO TB24: 25.0000 mg | ORAL_TABLET | Freq: Every day | ORAL | 0 refills | Status: DC

## 2022-12-25 MED ORDER — SODIUM CHLORIDE 0.9 % IV BOLUS
1000.0000 mL | Freq: Once | INTRAVENOUS | Status: AC
  Administered 2022-12-25: 1000 mL via INTRAVENOUS

## 2022-12-25 MED ORDER — LORAZEPAM 2 MG/ML IJ SOLN
1.0000 mg | Freq: Once | INTRAMUSCULAR | Status: AC
  Administered 2022-12-25: 1 mg via INTRAVENOUS
  Filled 2022-12-25: qty 1

## 2022-12-25 MED ORDER — AMLODIPINE BESYLATE 5 MG PO TABS: 10.0000 mg | ORAL_TABLET | Freq: Every day | ORAL | 0 refills | Status: AC

## 2022-12-25 MED ORDER — MORPHINE SULFATE (PF) 4 MG/ML IV SOLN
4.0000 mg | Freq: Once | INTRAVENOUS | Status: AC
  Administered 2022-12-25: 4 mg via INTRAVENOUS
  Filled 2022-12-25: qty 1

## 2022-12-25 MED ORDER — ONDANSETRON 4 MG PO TBDP: 4.0000 mg | ORAL_TABLET | Freq: Three times a day (TID) | ORAL | 0 refills | Status: DC | PRN

## 2022-12-25 MED ORDER — ALBUTEROL SULFATE HFA 108 (90 BASE) MCG/ACT IN AERS
2.0000 | INHALATION_SPRAY | Freq: Once | RESPIRATORY_TRACT | Status: AC
  Administered 2022-12-25: 2 via RESPIRATORY_TRACT
  Filled 2022-12-25: qty 6.7

## 2022-12-25 MED ORDER — ONDANSETRON 4 MG PO TBDP
4.0000 mg | ORAL_TABLET | Freq: Once | ORAL | Status: AC
  Administered 2022-12-25: 4 mg via ORAL
  Filled 2022-12-25: qty 1

## 2022-12-25 NOTE — ED Triage Notes (Addendum)
Pt c/o N/V, bodyaches, dry cough started last night. EMS placed pt on 4L 02 and Sa02 94%. EMS gave zofran 8 mg IV

## 2022-12-25 NOTE — Discharge Instructions (Addendum)
You need to change your medicaid to [place] (if you are moving here) so you can see GI and a PCP.

## 2022-12-25 NOTE — ED Provider Triage Note (Signed)
Emergency Medicine Provider Triage Evaluation Note  Bonnie Douglas , a 57 y.o. female  was evaluated in triage.  Pt complains of dyspnea, myalgia, diffuse she arrives via EMS.  Does individuals note the patient may have been hypoxic on arrival, improved with 3 L nasal cannula.  Seemingly patient symptoms began yesterday.  She has a smoker.  EMS reports patient was awake, alert, interactive in transport.  Review of Systems  Positive: Myalgia Negative: Focal rather than diffuse pain  Physical Exam  BP (!) 172/109 (BP Location: Right Arm)   Pulse (!) 106   Temp 99.1 F (37.3 C) (Oral)   Resp 20   Ht '5\' 7"'$  (1.702 m)   Wt 77.1 kg   SpO2 92%   BMI 26.62 kg/m  Gen:   Awake, no distress speaking clearly Resp:  Normal effort tachypneic MSK:   Moves extremities without difficulty no deformity Other:  Neuro grossly unremarkable  Medical Decision Making  Medically screening exam initiated at 11:11 AM.  Appropriate orders placed.  QUINTERRA HENKELMAN was informed that the remainder of the evaluation will be completed by another provider, this initial triage assessment does not replace that evaluation, and the importance of remaining in the ED until their evaluation is complete.   Carmin Muskrat, MD 12/25/22 [phone]

## 2022-12-25 NOTE — ED Notes (Signed)
Patient transported to X-ray 

## 2022-12-25 NOTE — ED Notes (Signed)
Pt ambulated in the room with a pulse ox with a steady gait, O2 sats maintained at 95% RA.

## 2022-12-25 NOTE — ED Provider Notes (Signed)
[place] Provider Note   CSN: [id number] Arrival date & time: 12/25/22  1101     History  Chief Complaint  Patient presents with   Emesis   bodyaches    Bonnie MONTO is a 57 y.o. female.  Pt is a 57 yo female with pmhx significant for for fibromyalgia, anal cancer (s/p chemo and radiation in [place]; no rsxn; last colonoscopy in 2022 was ok.  No GI doc here),  arthritis, depression, htn, and opiate abuse.  Pt has not been feeling well for the past few days.  She's had n/v and diffuse myalgias.  She called EMS who put her on 3L [place] because O2 sat was low.  Pt said sx started yesterday.       Home Medications Prior to Admission medications   Medication Sig Start Date End Date Taking? Authorizing Provider  ibuprofen (ADVIL) 600 MG tablet Take 1 tablet (600 mg total) by mouth every 6 (six) hours as needed. 12/25/22  Yes Isla Pence, MD  methocarbamol (ROBAXIN) 500 MG tablet Take 1 tablet (500 mg total) by mouth 2 (two) times daily. 12/25/22  Yes Isla Pence, MD  ondansetron (ZOFRAN-ODT) 4 MG disintegrating tablet Take 1 tablet (4 mg total) by mouth every 8 (eight) hours as needed for nausea or vomiting. 12/25/22  Yes Isla Pence, MD  amLODipine (NORVASC) 5 MG tablet Take 2 tablets (10 mg total) by mouth daily. 12/25/22   Isla Pence, MD  azithromycin (ZITHROMAX) 250 MG tablet Take 1 tablet (250 mg total) by mouth daily. 04/07/21   Carlisle Cater, PA-C  benzonatate (TESSALON) 100 MG capsule Take 1 capsule (100 mg total) by mouth every 8 (eight) hours. 04/07/21   Carlisle Cater, PA-C  metoprolol succinate (TOPROL-XL) 25 MG 24 hr tablet Take 1 tablet (25 mg total) by mouth daily. 12/25/22   Isla Pence, MD      Allergies    Biaxin [clarithromycin] and Vicodin [hydrocodone-acetaminophen]    Review of Systems   Review of Systems  Constitutional:  Positive for fever.  Respiratory:  Positive for cough.   Gastrointestinal:   Positive for nausea and vomiting.  All other systems reviewed and are negative.   Physical Exam Updated Vital Signs BP (!) 157/97   Pulse 98   Temp 98.9 F (37.2 C) (Oral)   Resp 19   Ht '5\' 7"'$  (1.702 m)   Wt 77.1 kg   SpO2 94%   BMI 26.62 kg/m  Physical Exam Vitals and nursing note reviewed.  Constitutional:      Appearance: Normal appearance.  HENT:     Head: Normocephalic and atraumatic.     Right Ear: External ear normal.     Left Ear: External ear normal.     Nose: Nose normal.     Mouth/Throat:     Mouth: Mucous membranes are dry.  Eyes:     Extraocular Movements: Extraocular movements intact.     Conjunctiva/sclera: Conjunctivae normal.     Pupils: Pupils are equal, round, and reactive to light.  Cardiovascular:     Rate and Rhythm: Normal rate and regular rhythm.     Pulses: Normal pulses.     Heart sounds: Normal heart sounds.  Pulmonary:     Effort: Pulmonary effort is normal.     Breath sounds: Normal breath sounds.  Abdominal:     General: Abdomen is flat. Bowel sounds are normal.     Palpations: Abdomen is soft.  Musculoskeletal:  General: Normal range of motion.     Cervical back: Normal range of motion and neck supple.  Skin:    General: Skin is warm.     Capillary Refill: Capillary refill takes less than 2 seconds.  Neurological:     General: No focal deficit present.     Mental Status: She is alert and oriented to person, place, and time.  Psychiatric:        Mood and Affect: Mood normal.        Behavior: Behavior normal.     ED Results / Procedures / Treatments   Labs (all labs ordered are listed, but only abnormal results are displayed) Labs Reviewed  RESP PANEL BY RT-PCR (RSV, FLU A&B, COVID)  RVPGX2 - Abnormal; Notable for the following components:      Result Value   Influenza A by PCR POSITIVE (*)    All other components within normal limits  CBC WITH DIFFERENTIAL/PLATELET - Abnormal; Notable for the following components:    RBC 2.89 (*)    Hemoglobin 8.9 (*)    HCT 28.5 (*)    Platelets 85 (*)    Lymphs Abs 0.4 (*)    All other components within normal limits  COMPREHENSIVE METABOLIC PANEL - Abnormal; Notable for the following components:   Sodium 134 (*)    Chloride 97 (*)    Glucose, Bld 105 (*)    Creatinine, Ser 1.05 (*)    All other components within normal limits  BASIC METABOLIC PANEL - Abnormal; Notable for the following components:   Sodium 134 (*)    CO2 21 (*)    Glucose, Bld 107 (*)    Creatinine, Ser 1.03 (*)    All other components within normal limits  BRAIN NATRIURETIC PEPTIDE  MAGNESIUM  VITAMIN B12  FOLATE  IRON AND TIBC  FERRITIN  RETICULOCYTES  I-STAT BETA HCG BLOOD, ED ([facility], [facility], [facility] ONLY)  POC OCCULT BLOOD, ED    EKG EKG Interpretation  Date/Time:  Thursday December 25 2022 11:22:28 EST Ventricular Rate:  105 PR Interval:  136 QRS Duration: 70 QT Interval:  348 QTC Calculation: 459 R Axis:   81 Text Interpretation: Sinus tachycardia Nonspecific ST and T wave abnormality Abnormal ECG When compared with ECG of 25-Dec-2022 11:21, PREVIOUS ECG IS PRESENT QTC improved Confirmed by Isla Pence [phone]) on 12/25/2022 5:56:59 PM  Radiology DG Chest 1 View  Result Date: 12/25/2022 CLINICAL DATA:  Left chest and back pain with dry cough for 2 days. EXAM: CHEST  1 VIEW COMPARISON:  Chest radiograph 04/07/2021 FINDINGS: The cardiomediastinal silhouette is normal There is no focal consolidation or pulmonary edema. There is no pleural effusion or pneumothorax There is no acute osseous abnormality. IMPRESSION: No radiographic evidence of acute cardiopulmonary process. Electronically Signed   By: Valetta Mole M.D.   On: 12/25/2022 12:13    Procedures Procedures    Medications Ordered in ED Medications  ketorolac (TORADOL) 15 MG/ML injection 15 mg (15 mg Intravenous Given 12/25/22 1119)  sodium chloride 0.9 % bolus 1,000 mL (1,000 mLs Intravenous New Bag/Given 12/25/22 1756)   albuterol (VENTOLIN HFA) 108 (90 Base) MCG/ACT inhaler 2 puff (2 puffs Inhalation Given 12/25/22 1505)  ondansetron (ZOFRAN-ODT) disintegrating tablet 4 mg (4 mg Oral Given 12/25/22 1505)  ibuprofen (ADVIL) tablet 600 mg (600 mg Oral Given 12/25/22 1505)  morphine (PF) 4 MG/ML injection 4 mg (4 mg Intravenous Given 12/25/22 1804)  LORazepam (ATIVAN) injection 1 mg (1 mg Intravenous Given 12/25/22 1842)  ED Course/ Medical Decision Making/ A&P                             Medical Decision Making Amount and/or Complexity of Data Reviewed Labs: ordered.  Risk Prescription drug management.   This patient presents to the ED for concern of n/v, this involves an extensive number of treatment options, and is a complaint that carries with it a high risk of complications and morbidity.  The differential diagnosis includes infection, flu/covid/rsv, pna   Co morbidities that complicate the patient evaluation  ibromyalgia, arthritis, depression, htn, and opiate abuse   Additional history obtained:  Additional history obtained from epic chart review    Lab Tests:  I Ordered, and personally interpreted labs.  The pertinent results include:  cbc with hgb low at 8.9 (13.7 3 months ago), bmp nl, flu A +, mg nl, bnp nl   Imaging Studies ordered:  I ordered imaging studies including cxr  I independently visualized and interpreted imaging which showed No radiographic evidence of acute cardiopulmonary process.  I agree with the radiologist interpretation   Cardiac Monitoring:  The patient was maintained on a cardiac monitor.  I personally viewed and interpreted the cardiac monitored which showed an underlying rhythm of: nsr   Medicines ordered and prescription drug management:  I ordered medication including toradol and morphine and ivfs and zofran/ativan  for sx  Reevaluation of the patient after these medicines showed that the patient improved I have reviewed the patients home  medicines and have made adjustments as needed   Test Considered:  cxr   Problem List / ED Course:  Influenza A:  pt able to ambulate w/o problems.  O2 sat stayed normal Anemia:  anemia panel ordered.  Hx anal cancer.  No GI doc in [place].  She has decided to move here, so she is told to change her medicaid to [place].  She is also given the number to [facility]. HTN:  pt given a refill on her meds   Reevaluation:  After the interventions noted above, I reevaluated the patient and found that they have :improved   Social Determinants of Health:  Lives at home   Dispostion:  After consideration of the diagnostic results and the patients response to treatment, I feel that the patent would benefit from discharge with outpatient f/u.          Final Clinical Impression(s) / ED Diagnoses Final diagnoses:  Dehydration  Influenza A  Medication refill  Anemia, unspecified type    Rx / DC Orders ED Discharge Orders          Ordered    Ambulatory referral to Gastroenterology        12/25/22 2004    amLODipine (NORVASC) 5 MG tablet  Daily        12/25/22 2005    metoprolol succinate (TOPROL-XL) 25 MG 24 hr tablet  Daily        12/25/22 2005    ondansetron (ZOFRAN-ODT) 4 MG disintegrating tablet  Every 8 hours PRN        12/25/22 2005    ibuprofen (ADVIL) 600 MG tablet  Every 6 hours PRN        12/25/22 2005    methocarbamol (ROBAXIN) 500 MG tablet  2 times daily        12/25/22 Adria Dill, MD 12/25/22 2007

## 2022-12-28 ENCOUNTER — Emergency Department (HOSPITAL_BASED_OUTPATIENT_CLINIC_OR_DEPARTMENT_OTHER): Payer: No Typology Code available for payment source

## 2022-12-28 ENCOUNTER — Other Ambulatory Visit: Payer: Self-pay

## 2022-12-28 ENCOUNTER — Inpatient Hospital Stay (HOSPITAL_COMMUNITY): Payer: No Typology Code available for payment source

## 2022-12-28 ENCOUNTER — Encounter (HOSPITAL_BASED_OUTPATIENT_CLINIC_OR_DEPARTMENT_OTHER): Payer: Self-pay | Admitting: Emergency Medicine

## 2022-12-28 ENCOUNTER — Inpatient Hospital Stay (HOSPITAL_BASED_OUTPATIENT_CLINIC_OR_DEPARTMENT_OTHER)
Admission: EM | Admit: 2022-12-28 | Discharge: 2023-01-08 | DRG: 871 | Disposition: A | Discharge status: Home / Self Care | Payer: No Typology Code available for payment source | Attending: Internal Medicine | Admitting: Internal Medicine

## 2022-12-28 DIAGNOSIS — I10 Essential (primary) hypertension: Secondary | ICD-10-CM | POA: Diagnosis present

## 2022-12-28 DIAGNOSIS — G8929 Other chronic pain: Secondary | ICD-10-CM | POA: Diagnosis present

## 2022-12-28 DIAGNOSIS — F32A Depression, unspecified: Secondary | ICD-10-CM | POA: Diagnosis present

## 2022-12-28 DIAGNOSIS — J189 Pneumonia, unspecified organism: Principal | ICD-10-CM

## 2022-12-28 DIAGNOSIS — M4854XA Collapsed vertebra, not elsewhere classified, thoracic region, initial encounter for fracture: Secondary | ICD-10-CM | POA: Diagnosis present

## 2022-12-28 DIAGNOSIS — F17210 Nicotine dependence, cigarettes, uncomplicated: Secondary | ICD-10-CM | POA: Diagnosis present

## 2022-12-28 DIAGNOSIS — N179 Acute kidney failure, unspecified: Secondary | ICD-10-CM | POA: Diagnosis present

## 2022-12-28 DIAGNOSIS — E872 Acidosis, unspecified: Secondary | ICD-10-CM | POA: Diagnosis present

## 2022-12-28 DIAGNOSIS — D72819 Decreased white blood cell count, unspecified: Secondary | ICD-10-CM | POA: Diagnosis present

## 2022-12-28 DIAGNOSIS — J1001 Influenza due to other identified influenza virus with the same other identified influenza virus pneumonia: Secondary | ICD-10-CM | POA: Diagnosis present

## 2022-12-28 DIAGNOSIS — Z1152 Encounter for screening for COVID-19: Secondary | ICD-10-CM | POA: Diagnosis not present

## 2022-12-28 DIAGNOSIS — R6520 Severe sepsis without septic shock: Secondary | ICD-10-CM | POA: Diagnosis present

## 2022-12-28 DIAGNOSIS — Z9221 Personal history of antineoplastic chemotherapy: Secondary | ICD-10-CM

## 2022-12-28 DIAGNOSIS — D649 Anemia, unspecified: Secondary | ICD-10-CM | POA: Diagnosis present

## 2022-12-28 DIAGNOSIS — J111 Influenza due to unidentified influenza virus with other respiratory manifestations: Secondary | ICD-10-CM

## 2022-12-28 DIAGNOSIS — G729 Myopathy, unspecified: Secondary | ICD-10-CM | POA: Diagnosis present

## 2022-12-28 DIAGNOSIS — D696 Thrombocytopenia, unspecified: Secondary | ICD-10-CM | POA: Diagnosis present

## 2022-12-28 DIAGNOSIS — Z6830 Body mass index (BMI) 30.0-30.9, adult: Secondary | ICD-10-CM

## 2022-12-28 DIAGNOSIS — Z91040 Latex allergy status: Secondary | ICD-10-CM

## 2022-12-28 DIAGNOSIS — K529 Noninfective gastroenteritis and colitis, unspecified: Secondary | ICD-10-CM | POA: Diagnosis present

## 2022-12-28 DIAGNOSIS — F1120 Opioid dependence, uncomplicated: Secondary | ICD-10-CM | POA: Diagnosis present

## 2022-12-28 DIAGNOSIS — A4189 Other specified sepsis: Secondary | ICD-10-CM | POA: Diagnosis present

## 2022-12-28 DIAGNOSIS — M1990 Unspecified osteoarthritis, unspecified site: Secondary | ICD-10-CM | POA: Diagnosis present

## 2022-12-28 DIAGNOSIS — E860 Dehydration: Secondary | ICD-10-CM | POA: Diagnosis present

## 2022-12-28 DIAGNOSIS — M797 Fibromyalgia: Secondary | ICD-10-CM | POA: Diagnosis present

## 2022-12-28 DIAGNOSIS — J159 Unspecified bacterial pneumonia: Secondary | ICD-10-CM | POA: Diagnosis present

## 2022-12-28 DIAGNOSIS — F1911 Other psychoactive substance abuse, in remission: Secondary | ICD-10-CM | POA: Diagnosis present

## 2022-12-28 DIAGNOSIS — Z597 Insufficient social insurance and welfare support: Secondary | ICD-10-CM

## 2022-12-28 DIAGNOSIS — J9601 Acute respiratory failure with hypoxia: Secondary | ICD-10-CM | POA: Diagnosis present

## 2022-12-28 DIAGNOSIS — R609 Edema, unspecified: Secondary | ICD-10-CM | POA: Diagnosis not present

## 2022-12-28 DIAGNOSIS — J80 Acute respiratory distress syndrome: Secondary | ICD-10-CM | POA: Diagnosis present

## 2022-12-28 DIAGNOSIS — E876 Hypokalemia: Secondary | ICD-10-CM | POA: Diagnosis present

## 2022-12-28 DIAGNOSIS — Z23 Encounter for immunization: Secondary | ICD-10-CM

## 2022-12-28 DIAGNOSIS — E669 Obesity, unspecified: Secondary | ICD-10-CM | POA: Diagnosis present

## 2022-12-28 DIAGNOSIS — A419 Sepsis, unspecified organism: Secondary | ICD-10-CM | POA: Diagnosis present

## 2022-12-28 DIAGNOSIS — R112 Nausea with vomiting, unspecified: Secondary | ICD-10-CM | POA: Diagnosis not present

## 2022-12-28 DIAGNOSIS — Z716 Tobacco abuse counseling: Secondary | ICD-10-CM

## 2022-12-28 DIAGNOSIS — Z923 Personal history of irradiation: Secondary | ICD-10-CM

## 2022-12-28 DIAGNOSIS — R197 Diarrhea, unspecified: Secondary | ICD-10-CM | POA: Diagnosis not present

## 2022-12-28 DIAGNOSIS — Z881 Allergy status to other antibiotic agents status: Secondary | ICD-10-CM

## 2022-12-28 DIAGNOSIS — Z8249 Family history of ischemic heart disease and other diseases of the circulatory system: Secondary | ICD-10-CM

## 2022-12-28 DIAGNOSIS — Z79899 Other long term (current) drug therapy: Secondary | ICD-10-CM

## 2022-12-28 DIAGNOSIS — R0602 Shortness of breath: Secondary | ICD-10-CM | POA: Diagnosis not present

## 2022-12-28 DIAGNOSIS — J101 Influenza due to other identified influenza virus with other respiratory manifestations: Secondary | ICD-10-CM | POA: Diagnosis not present

## 2022-12-28 DIAGNOSIS — Z85048 Personal history of other malignant neoplasm of rectum, rectosigmoid junction, and anus: Secondary | ICD-10-CM

## 2022-12-28 DIAGNOSIS — R0902 Hypoxemia: Secondary | ICD-10-CM

## 2022-12-28 HISTORY — DX: Malignant neoplasm of anus, unspecified: C21.0

## 2022-12-28 HISTORY — DX: Opioid abuse, uncomplicated: F11.10

## 2022-12-28 LAB — BLOOD GAS, ARTERIAL
Acid-base deficit: 2.1 mmol/L — ABNORMAL HIGH (ref 0.0–2.0)
Bicarbonate: 22.3 mmol/L (ref 20.0–28.0)
O2 Saturation: 99.8 %
Patient temperature: 36.8
pCO2 arterial: 36 mmHg (ref 32–48)
pH, Arterial: 7.4 (ref 7.35–7.45)
pO2, Arterial: 99 mmHg (ref 83–108)

## 2022-12-28 LAB — CBC WITH DIFFERENTIAL/PLATELET
Abs Immature Granulocytes: 0.01 10*3/uL (ref 0.00–0.07)
Basophils Absolute: 0 10*3/uL (ref 0.0–0.1)
Basophils Relative: 1 %
Eosinophils Absolute: 0 10*3/uL (ref 0.0–0.5)
Eosinophils Relative: 0 %
HCT: 35.1 % — ABNORMAL LOW (ref 36.0–46.0)
Hemoglobin: 11.5 g/dL — ABNORMAL LOW (ref 12.0–15.0)
Immature Granulocytes: 1 %
Lymphocytes Relative: 23 %
Lymphs Abs: 0.3 10*3/uL — ABNORMAL LOW (ref 0.7–4.0)
MCH: 29.6 pg (ref 26.0–34.0)
MCHC: 32.8 g/dL (ref 30.0–36.0)
MCV: 90.2 fL (ref 80.0–100.0)
Monocytes Absolute: 0 10*3/uL — ABNORMAL LOW (ref 0.1–1.0)
Monocytes Relative: 2 %
Neutro Abs: 1.1 10*3/uL — ABNORMAL LOW (ref 1.7–7.7)
Neutrophils Relative %: 73 %
Platelets: 115 10*3/uL — ABNORMAL LOW (ref 150–400)
RBC: 3.89 MIL/uL (ref 3.87–5.11)
RDW: 14.9 % (ref 11.5–15.5)
WBC Morphology: INCREASED
WBC: 1.5 10*3/uL — ABNORMAL LOW (ref 4.0–10.5)
nRBC: 0 % (ref 0.0–0.2)

## 2022-12-28 LAB — COMPREHENSIVE METABOLIC PANEL
ALT: 19 U/L (ref 0–44)
AST: 44 U/L — ABNORMAL HIGH (ref 15–41)
Albumin: 3.5 g/dL (ref 3.5–5.0)
Alkaline Phosphatase: 49 U/L (ref 38–126)
Anion gap: 18 — ABNORMAL HIGH (ref 5–15)
BUN: 42 mg/dL — ABNORMAL HIGH (ref 6–20)
CO2: 22 mmol/L (ref 22–32)
Calcium: 8.3 mg/dL — ABNORMAL LOW (ref 8.9–10.3)
Chloride: 95 mmol/L — ABNORMAL LOW (ref 98–111)
Creatinine, Ser: 3.88 mg/dL — ABNORMAL HIGH (ref 0.44–1.00)
GFR, Estimated: 13 mL/min — ABNORMAL LOW (ref >60)
Glucose, Bld: 96 mg/dL (ref 70–99)
Potassium: 3.3 mmol/L — ABNORMAL LOW (ref 3.5–5.1)
Sodium: 135 mmol/L (ref 135–145)
Total Bilirubin: 0.6 mg/dL (ref 0.3–1.2)
Total Protein: 7.5 g/dL (ref 6.5–8.1)

## 2022-12-28 LAB — RAPID URINE DRUG SCREEN, HOSP PERFORMED
Amphetamines: NOT DETECTED
Barbiturates: NOT DETECTED
Benzodiazepines: NOT DETECTED
Cocaine: NOT DETECTED
Opiates: POSITIVE — AB
Tetrahydrocannabinol: NOT DETECTED

## 2022-12-28 LAB — GLUCOSE, CAPILLARY
Glucose-Capillary: 83 mg/dL (ref 70–99)
Glucose-Capillary: 106 mg/dL — ABNORMAL HIGH (ref 70–99)

## 2022-12-28 LAB — URINALYSIS, ROUTINE W REFLEX MICROSCOPIC
Bilirubin Urine: NEGATIVE
Glucose, UA: NEGATIVE mg/dL
Ketones, ur: NEGATIVE mg/dL
Leukocytes,Ua: NEGATIVE
Nitrite: NEGATIVE
Protein, ur: 100 mg/dL — AB
Specific Gravity, Urine: 1.017 (ref 1.005–1.030)
pH: 5 (ref 5.0–8.0)

## 2022-12-28 LAB — STREP PNEUMONIAE URINARY ANTIGEN: Strep Pneumo Urinary Antigen: POSITIVE — AB

## 2022-12-28 LAB — LACTIC ACID, PLASMA
Lactic Acid, Venous: 3.3 mmol/L (ref 0.5–1.9)
Lactic Acid, Venous: 2.8 mmol/L (ref 0.5–1.9)

## 2022-12-28 LAB — LIPASE, BLOOD: Lipase: 10 U/L — ABNORMAL LOW (ref 11–51)

## 2022-12-28 LAB — HIV ANTIBODY (ROUTINE TESTING W REFLEX): HIV Screen 4th Generation wRfx: NONREACTIVE

## 2022-12-28 LAB — MRSA NEXT GEN BY PCR, NASAL: MRSA by PCR Next Gen: NOT DETECTED

## 2022-12-28 LAB — TROPONIN I (HIGH SENSITIVITY): Troponin I (High Sensitivity): 9 ng/L (ref <18)

## 2022-12-28 LAB — CK: Total CK: 722 U/L — ABNORMAL HIGH (ref 38–234)

## 2022-12-28 LAB — PROCALCITONIN: Procalcitonin: 51.51 ng/mL

## 2022-12-28 MED ORDER — SODIUM CHLORIDE 0.9 % IV SOLN: INTRAVENOUS | Status: DC

## 2022-12-28 MED ORDER — SODIUM CHLORIDE 0.9 % IV SOLN
1.0000 g | Freq: Once | INTRAVENOUS | Status: AC
  Administered 2022-12-28: 1 g via INTRAVENOUS
  Filled 2022-12-28: qty 10

## 2022-12-28 MED ORDER — OSELTAMIVIR PHOSPHATE 30 MG PO CAPS
30.0000 mg | ORAL_CAPSULE | Freq: Every day | ORAL | Status: DC
  Administered 2022-12-28 – 2022-12-31 (×4): 30 mg via ORAL
  Filled 2022-12-28 (×4): qty 1

## 2022-12-28 MED ORDER — ACETAMINOPHEN 650 MG RE SUPP: 650.0000 mg | Freq: Four times a day (QID) | RECTAL | Status: DC | PRN

## 2022-12-28 MED ORDER — MORPHINE SULFATE (PF) 2 MG/ML IV SOLN
2.0000 mg | INTRAVENOUS | Status: DC | PRN
  Administered 2022-12-28: 2 mg via INTRAVENOUS
  Filled 2022-12-28: qty 1

## 2022-12-28 MED ORDER — METHOCARBAMOL 500 MG PO TABS
500.0000 mg | ORAL_TABLET | Freq: Two times a day (BID) | ORAL | Status: DC
  Administered 2022-12-28 – 2023-01-08 (×23): 500 mg via ORAL
  Filled 2022-12-28 (×23): qty 1

## 2022-12-28 MED ORDER — LACTATED RINGERS IV SOLN: INTRAVENOUS | Status: DC

## 2022-12-28 MED ORDER — LACTATED RINGERS IV BOLUS
1000.0000 mL | Freq: Once | INTRAVENOUS | Status: AC
  Administered 2022-12-28: 1000 mL via INTRAVENOUS

## 2022-12-28 MED ORDER — TRAMADOL HCL 50 MG PO TABS
50.0000 mg | ORAL_TABLET | Freq: Two times a day (BID) | ORAL | Status: DC | PRN
  Administered 2022-12-28 – 2023-01-07 (×17): 50 mg via ORAL
  Filled 2022-12-28 (×17): qty 1

## 2022-12-28 MED ORDER — KETOROLAC TROMETHAMINE 15 MG/ML IJ SOLN
15.0000 mg | Freq: Once | INTRAMUSCULAR | Status: AC
  Administered 2022-12-28: 15 mg via INTRAVENOUS
  Filled 2022-12-28: qty 1

## 2022-12-28 MED ORDER — NICOTINE 14 MG/24HR TD PT24
14.0000 mg | MEDICATED_PATCH | Freq: Every day | TRANSDERMAL | Status: DC
  Filled 2022-12-28 (×2): qty 1

## 2022-12-28 MED ORDER — SODIUM CHLORIDE 0.9 % IV SOLN: Freq: Once | INTRAVENOUS | Status: AC

## 2022-12-28 MED ORDER — POTASSIUM CHLORIDE CRYS ER 20 MEQ PO TBCR: 40.0000 meq | EXTENDED_RELEASE_TABLET | ORAL | Status: DC

## 2022-12-28 MED ORDER — PANTOPRAZOLE SODIUM 40 MG IV SOLR
40.0000 mg | INTRAVENOUS | Status: DC
  Administered 2022-12-28: 40 mg via INTRAVENOUS
  Filled 2022-12-28: qty 10

## 2022-12-28 MED ORDER — SODIUM CHLORIDE 0.9 % IV SOLN: 2.0000 g | INTRAVENOUS | Status: DC

## 2022-12-28 MED ORDER — GUAIFENESIN ER 600 MG PO TB12
1200.0000 mg | ORAL_TABLET | Freq: Two times a day (BID) | ORAL | Status: DC
  Administered 2022-12-28 – 2023-01-08 (×23): 1200 mg via ORAL
  Filled 2022-12-28 (×24): qty 2

## 2022-12-28 MED ORDER — METHYLPREDNISOLONE SODIUM SUCC 125 MG IJ SOLR
60.0000 mg | Freq: Every day | INTRAMUSCULAR | Status: DC
  Administered 2022-12-28: 60 mg via INTRAVENOUS
  Filled 2022-12-28: qty 2

## 2022-12-28 MED ORDER — DOXYCYCLINE HYCLATE 100 MG PO TABS
100.0000 mg | ORAL_TABLET | Freq: Once | ORAL | Status: AC
  Administered 2022-12-28: 100 mg via ORAL
  Filled 2022-12-28: qty 1

## 2022-12-28 MED ORDER — CALCIUM GLUCONATE-NACL 2-0.675 GM/100ML-% IV SOLN
2.0000 g | Freq: Once | INTRAVENOUS | Status: AC
  Administered 2022-12-28: 2000 mg via INTRAVENOUS
  Filled 2022-12-28: qty 100

## 2022-12-28 MED ORDER — ACETAMINOPHEN 325 MG PO TABS
650.0000 mg | ORAL_TABLET | Freq: Four times a day (QID) | ORAL | Status: DC | PRN
  Administered 2022-12-28 – 2023-01-05 (×11): 650 mg via ORAL
  Filled 2022-12-28 (×13): qty 2

## 2022-12-28 MED ORDER — ALBUTEROL SULFATE (2.5 MG/3ML) 0.083% IN NEBU
2.5000 mg | INHALATION_SOLUTION | RESPIRATORY_TRACT | Status: DC | PRN
  Administered 2022-12-29 – 2022-12-31 (×3): 2.5 mg via RESPIRATORY_TRACT
  Filled 2022-12-28 (×3): qty 3

## 2022-12-28 MED ORDER — CHLORHEXIDINE GLUCONATE CLOTH 2 % EX PADS
6.0000 | MEDICATED_PAD | Freq: Every day | CUTANEOUS | Status: DC
  Administered 2022-12-30: 6 via TOPICAL

## 2022-12-28 MED ORDER — METHADONE HCL 10 MG PO TABS: 29.0000 mg | ORAL_TABLET | Freq: Every day | ORAL | Status: DC

## 2022-12-28 MED ORDER — PANTOPRAZOLE SODIUM 40 MG IV SOLR
40.0000 mg | Freq: Once | INTRAVENOUS | Status: AC
  Administered 2022-12-28: 40 mg via INTRAVENOUS
  Filled 2022-12-28: qty 10

## 2022-12-28 MED ORDER — SODIUM CHLORIDE 0.9 % IV SOLN
25.0000 mg | Freq: Once | INTRAVENOUS | Status: AC
  Administered 2022-12-28: 25 mg via INTRAVENOUS
  Filled 2022-12-28: qty 1

## 2022-12-28 MED ORDER — HEPARIN SODIUM (PORCINE) 5000 UNIT/ML IJ SOLN
5000.0000 [IU] | Freq: Three times a day (TID) | INTRAMUSCULAR | Status: DC
  Administered 2022-12-28 – 2023-01-08 (×33): 5000 [IU] via SUBCUTANEOUS
  Filled 2022-12-28 (×33): qty 1

## 2022-12-28 MED ORDER — PROMETHAZINE HCL 25 MG/ML IJ SOLN
INTRAMUSCULAR | Status: AC
  Filled 2022-12-28: qty 1

## 2022-12-28 MED ORDER — SODIUM CHLORIDE 0.9 % IV SOLN
100.0000 mg | Freq: Two times a day (BID) | INTRAVENOUS | Status: DC
  Administered 2022-12-28: 100 mg via INTRAVENOUS
  Filled 2022-12-28 (×3): qty 100

## 2022-12-28 MED ORDER — METHOCARBAMOL 1000 MG/10ML IJ SOLN
500.0000 mg | Freq: Two times a day (BID) | INTRAVENOUS | Status: DC
  Filled 2022-12-28: qty 5

## 2022-12-28 MED ORDER — MORPHINE SULFATE (PF) 2 MG/ML IV SOLN: 2.0000 mg | INTRAVENOUS | Status: DC | PRN

## 2022-12-28 MED ORDER — METHADONE HCL 10 MG PO TABS
30.0000 mg | ORAL_TABLET | Freq: Every day | ORAL | Status: DC
  Administered 2022-12-28 – 2023-01-08 (×12): 30 mg via ORAL
  Filled 2022-12-28 (×12): qty 3

## 2022-12-28 MED ORDER — POTASSIUM CHLORIDE 10 MEQ/100ML IV SOLN
10.0000 meq | INTRAVENOUS | Status: AC
  Administered 2022-12-28 (×2): 10 meq via INTRAVENOUS
  Filled 2022-12-28 (×2): qty 100

## 2022-12-28 MED ORDER — DICYCLOMINE HCL 10 MG/ML IM SOLN
20.0000 mg | Freq: Once | INTRAMUSCULAR | Status: AC
  Administered 2022-12-28: 20 mg via INTRAMUSCULAR
  Filled 2022-12-28: qty 2

## 2022-12-28 MED ORDER — POTASSIUM CHLORIDE CRYS ER 20 MEQ PO TBCR
20.0000 meq | EXTENDED_RELEASE_TABLET | Freq: Once | ORAL | Status: AC
  Administered 2022-12-28: 20 meq via ORAL
  Filled 2022-12-28: qty 1

## 2022-12-28 MED ORDER — SODIUM CHLORIDE 0.9 % IV SOLN: Freq: Once | INTRAVENOUS | Status: DC

## 2022-12-28 NOTE — H&P (Addendum)
History and Physical    Patient: Bonnie Douglas [id number] DOB: 1966/04/24 DOA: 12/28/2022 DOS: the patient was seen and examined on 12/28/2022 PCP: System, Provider Not In  Patient coming from: [place] transfer  Chief Complaint:  Chief Complaint  Patient presents with   N/V/D   HPI: Bonnie Douglas is a 57 y.o. 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  She had been unable to keep any significant mental food or liquids down.  Denies having any blood present in her stools or emesis.  Patient reported associated symptoms of bodyaches all over as well as a dry cough.  She had been seen in the emergency department on 2/29 due to the symptoms and had been diagnosed with the flu at that time, but chest x-ray was noted to be clear.  She had initially been placed on 4 L of nasal cannula oxygen with EMS, but was able to be weaned to room air prior to discharge.  Patient had been given a liter of IV fluids, antiemetics, and pain medications while in the ED.  She had been sent home with prescription for amlodipine, metoprolol, Zofran, ibuprofen, and Robaxin.  Despite this she continued to have nausea, vomiting, and diarrhea.  She became progressively weak and states that the cough is now productive with yellow sputum production.  She has been unable to sit up or even move off the couch.  Her daughter has been giving her medications.  In the emergency department patient was noted to be afebrile with heart rates elevated up to 111, respirations 15-26, blood pressures as low as 85 numbers 72-1 16/83, and O2 saturations as low as 89% with improvement greater than 92% currently on simple facemask at 7 L.  Labs significant for WBC 1.5 with increasing bands, hemoglobin 11.5, platelet count 115, potassium 3.3, BUN 42, and  creatinine 3.88.    Fecal occult stool studies were negative.  Chest x-ray noted patchy airspace disease in the mid and lower lung fields bilaterally concerning for pneumonia. Blood cultures have been obtained.  Patient had been given 1.5 L of IV fluids, 20 mEq of potassium chloride IV, antiemetics, Protonix 40 mg IV, Rocephin, and doxycycline.   Review of Systems: As mentioned in the history of present illness. All other systems reviewed and are negative. Past Medical History:  Diagnosis Date   Anal cancer ([place])    Arthritis    Depression    Fibromyalgia    Heroin abuse ([place])    Hypertension    Tobacco abuse 09/22/2012   Past Surgical History:  Procedure Laterality Date   LEFT HEART CATHETERIZATION WITH CORONARY ANGIOGRAM N/A 09/22/2012   Procedure: LEFT HEART CATHETERIZATION WITH CORONARY ANGIOGRAM;  Surgeon: Troy Sine, MD;  Location: [facility] CATH LAB;  Service: Cardiovascular;  Laterality: N/A;   uterine ablation     Social History:  reports that she has been smoking cigarettes. She has been smoking an average of .5 packs per day. She does not have any smokeless tobacco history on file. She reports that she does not currently use drugs after having used the following drugs: Marijuana. She reports that she does not drink alcohol.  Allergies  Allergen Reactions   Biaxin [Clarithromycin] Nausea And Vomiting   Latex    Vicodin [Hydrocodone-Acetaminophen] Other (See Comments)    heacache    Family History  Problem  Relation Age of Onset   Heart attack Other     Prior to Admission medications   Medication Sig Start Date End Date Taking? Authorizing Provider  amLODipine (NORVASC) 5 MG tablet Take 2 tablets (10 mg total) by mouth daily. 12/25/22   Isla Pence, MD  azithromycin (ZITHROMAX) 250 MG tablet Take 1 tablet (250 mg total) by mouth daily. 04/07/21   Carlisle Cater, PA-C  benzonatate (TESSALON) 100 MG capsule Take 1 capsule (100 mg total) by mouth every 8 (eight) hours.  04/07/21   Carlisle Cater, PA-C  ibuprofen (ADVIL) 600 MG tablet Take 1 tablet (600 mg total) by mouth every 6 (six) hours as needed. 12/25/22   Isla Pence, MD  methocarbamol (ROBAXIN) 500 MG tablet Take 1 tablet (500 mg total) by mouth 2 (two) times daily. 12/25/22   Isla Pence, MD  metoprolol succinate (TOPROL-XL) 25 MG 24 hr tablet Take 1 tablet (25 mg total) by mouth daily. 12/25/22   Isla Pence, MD  ondansetron (ZOFRAN-ODT) 4 MG disintegrating tablet Take 1 tablet (4 mg total) by mouth every 8 (eight) hours as needed for nausea or vomiting. 12/25/22   Isla Pence, MD    Physical Exam: Vitals:   12/28/22 0745 12/28/22 0757 12/28/22 0800 12/28/22 0852  BP:   113/72   Pulse: (!) 102  (!) 102 (!) 111  Resp: '17  18 20  '$ Temp:  99.7 F (37.6 C)  98.2 F (36.8 C)  TempSrc:  Oral  Oral  SpO2: 100%  100% 93%  Weight:    94.5 kg  Height:    '5\' 9"'$  (1.753 m)   Constitutional: middle aged female who appears to be in acute distress Eyes: PERRL, lids and conjunctivae normal ENMT: Mucous membranes are dry  Neck: normal, supple Respiratory: Tachypneic with decreased overall aeration.  No appreciable wheezes.  Patient O2 saturations initially in upper 80s on high flow at 13 L. Cardiovascular: Tachycardic, no murmurs / rubs / gallops. No extremity edema. Abdomen: Seems to have generalized tenderness to palpation all over.  Bowel sounds present. Musculoskeletal: no clubbing / cyanosis. No joint deformity upper and lower extremities. Good ROM, no contractures. Normal muscle tone.  Skin: no rashes, lesions, ulcers.   Neurologic: CN 2-12 grossly intact.  Patient appears to have global weakness. Psychiatric: Normal judgment and insight. Alert and oriented x 3.  Anxious mood.   Data Reviewed:  Reviewed labs, imaging, and pertinent records as noted above in HPI.  Assessment and Plan:  Acute respiratory failure with hypoxia Sepsis due to pneumonia Influenza a  Patient has been  diagnosed with influenza A on 12/25/2022.  Patient had not been given Tamiflu it seems and now appears to be out of the therapeutic window.  She presented with tachycardia and tachypnea with WBC 1.5 with bandemia.  No initial lactic acid have been obtained.  O2 saturations as low as 89%, but currently requiring high flow nasal cannula oxygen 13 L to maintain O2 saturations around 93%.  Chest x-ray noted concern for multifocal pneumonia.  Question the possibility of aspiration as well given multiple reports of nausea and vomiting. -Admit to a progressive bed -Aspiration precautions with elevation of head of bed -Continuous pulse oximetry with oxygen maintain O2 saturation greater than 90%. -Incentive spirometry and flutter valve -Follow-up blood cultures -Check procalcitonin and lactic acid -Check ABG -Continue empiric antibiotics of Rocephin and doxycycline -Morphine IV as needed in the interim -Breathing treatments as needed -Mucinex -PCCM consulted to evaluate   Acute renal  failure Patient presents with creatinine elevated to 3.88 with BUN 42.  CK was elevated at 722.  Baseline creatinine previously noted be around 1 on 2/29.  Suspect prerenal given reports of nausea, vomiting, and diarrhea over the last 4-5 days. -Strict I&Os -Check urinalysis -Continue IV fluids at 150 mL/h -Avoid nephrotoxic agents  Nausea, vomiting, diarrhea Acute.  Thought secondary to influenza, but also had note for the possibility of withdrawals. -Continue supportive care  Hypokalemia Hypocalcemia Acute.  Potassium 3.3 and calcium 8.3.  Patient has been given potassium chloride 20 mEq IV. -Give additional potassium chloride 20 mEq p.o. -Give calcium gluconate 2 g IV -Continue to monitor and replace electrolytes as needed  History of polysubstance abuse Patient with prior history of heroin abuse, cocaine, marijuana, but reports currently being on methadone '29mg'$  daily and reports going to alcohol and drug  services at [place]. to receive the methadone. -Continue methadone once able to verify at a dose on 3/4 -Nicotine patch offered  Thrombocytopenia Acute on chronic.  Platelet count 115 on admission.  No reports of bleeding. -Continue to monitor   GI prophylaxis: Protonix DVT prophylaxis: Heparin Advance Care Planning:   Code Status: Full Code    Consults: PCCM  Family Communication: Daughter updated over the phone  Severity of Illness: The appropriate patient status for this patient is INPATIENT. Inpatient status is judged to be reasonable and necessary in order to provide the required intensity of service to ensure the patient's safety. The patient's presenting symptoms, physical exam findings, and initial radiographic and laboratory data in the context of their chronic comorbidities is felt to place them at high risk for further clinical deterioration. Furthermore, it is not anticipated that the patient will be medically stable for discharge from the hospital within 2 midnights of admission.   * I certify that at the point of admission it is my clinical judgment that the patient will require inpatient hospital care spanning beyond 2 midnights from the point of admission due to high intensity of service, high risk for further deterioration and high frequency of surveillance required.*  Author: Norval Morton, MD 12/28/2022 8:56 AM  For on call review [email or web address].

## 2022-12-28 NOTE — Progress Notes (Signed)
Pt CPT not done at this time. Pt is in too much distress and is requiring a NRB at this time to keep her sats > 90%. CCM came to assess Pt and Pt will be transfered to MICU.

## 2022-12-28 NOTE — ED Triage Notes (Signed)
To ED via EMS from home with c/o pain all over and N/V/D x2 days. Tested positive for the flu a few days ago. MD at bedside.

## 2022-12-28 NOTE — ED Provider Notes (Signed)
[facility] Provider Note: Bonnie Spurling, MD, FACEP  CSN: [id number] MRN: [id number] ARRIVAL: 12/28/22 at [place]: [place]  N/V/D   HISTORY OF PRESENT ILLNESS  12/28/22 2:58 AM Bonnie Douglas is a 57 y.o. female who was seen on 12/25/2022 for nausea, vomiting and myalgias that began the day before.  She was diagnosed with influenza A, treated and discharged home.  The nausea, vomiting and diarrhea has persisted and she has not been able to keep yourself hydrated.  She complains of generalized bodyaches which she rates as a 10 out of 10.  She is also having abdominal cramping without abdominal tenderness.  The patient has a history of heroin abuse and was treated for withdrawal 08/30/2022 after taking Suboxone.  The withdrawal manifested as nausea, vomiting and diarrhea with diffuse abdominal pain.   Past Medical History:  Diagnosis Date   Anal cancer ([place])    Arthritis    Depression    Fibromyalgia    Heroin abuse ([place])    Hypertension    Tobacco abuse 09/22/2012    Past Surgical History:  Procedure Laterality Date   LEFT HEART CATHETERIZATION WITH CORONARY ANGIOGRAM N/A 09/22/2012   Procedure: LEFT HEART CATHETERIZATION WITH CORONARY ANGIOGRAM;  Surgeon: Troy Sine, MD;  Location: [facility] CATH LAB;  Service: Cardiovascular;  Laterality: N/A;   uterine ablation      Family History  Problem Relation Age of Onset   Heart attack Other     Social History   Tobacco Use   Smoking status: Every Day    Packs/day: 0.50    Types: Cigarettes  Substance Use Topics   Alcohol use: No   Drug use: Not Currently    Types: Marijuana    Prior to Admission medications   Medication Sig Start Date End Date Taking? Authorizing Provider  amLODipine (NORVASC) 5 MG tablet Take 2 tablets (10 mg total) by mouth daily. 12/25/22   Isla Pence, MD  azithromycin (ZITHROMAX) 250 MG tablet Take 1 tablet (250 mg total) by mouth daily. 04/07/21   Carlisle Cater,  PA-C  benzonatate (TESSALON) 100 MG capsule Take 1 capsule (100 mg total) by mouth every 8 (eight) hours. 04/07/21   Carlisle Cater, PA-C  ibuprofen (ADVIL) 600 MG tablet Take 1 tablet (600 mg total) by mouth every 6 (six) hours as needed. 12/25/22   Isla Pence, MD  methocarbamol (ROBAXIN) 500 MG tablet Take 1 tablet (500 mg total) by mouth 2 (two) times daily. 12/25/22   Isla Pence, MD  metoprolol succinate (TOPROL-XL) 25 MG 24 hr tablet Take 1 tablet (25 mg total) by mouth daily. 12/25/22   Isla Pence, MD  ondansetron (ZOFRAN-ODT) 4 MG disintegrating tablet Take 1 tablet (4 mg total) by mouth every 8 (eight) hours as needed for nausea or vomiting. 12/25/22   Isla Pence, MD    Allergies Biaxin [clarithromycin], Latex, and Vicodin [hydrocodone-acetaminophen]   REVIEW OF SYSTEMS  Negative except as noted here or in the History of Present Illness.   PHYSICAL EXAMINATION  Initial Vital Signs Blood pressure 106/82, pulse (!) 108, temperature 99.1 F (37.3 C), resp. rate 18, height '5\' 8"'$  (1.727 m), weight 86.2 kg, SpO2 (!) 89 %.  Examination General: Well-developed, well-nourished female in no acute distress; appearance consistent with age of record HENT: normocephalic; atraumatic; dry mucous membranes Eyes: Normal appearance Neck: supple Heart: regular rate and rhythm; tachycardia Lungs: clear to auscultation bilaterally Abdomen: soft; nondistended; nontender; bowel sounds present Extremities: No deformity;  full range of motion; pulses normal Neurologic: Awake, alert and oriented; motor function intact in all extremities and symmetric; no facial droop Skin: Warm and dry Psychiatric: Moaning   RESULTS  Summary of this visit's results, reviewed and interpreted by myself:   EKG Interpretation  Date/Time:  Sunday December 28 2022 02:57:25 EST Ventricular Rate:  110 PR Interval:  132 QRS Duration: 72 QT Interval:  322 QTC Calculation: 436 R Axis:   82 Text  Interpretation: Sinus tachycardia Ventricular premature complex Aberrant complex Probable left atrial enlargement No significant change was found Confirmed by Kehaulani Fruin, Jenny Reichmann [phone]) on 12/28/2022 3:03:51 AM       Laboratory Studies: Results for orders placed or performed during the hospital encounter of 12/28/22 (from the past 24 hour(s))  CBC with Differential     Status: Abnormal   Collection Time: 12/28/22  3:20 AM  Result Value Ref Range   WBC 1.5 (L) 4.0 - 10.5 K/uL   RBC 3.89 3.87 - 5.11 MIL/uL   Hemoglobin 11.5 (L) 12.0 - 15.0 g/dL   HCT 35.1 (L) 36.0 - 46.0 %   MCV 90.2 80.0 - 100.0 fL   MCH 29.6 26.0 - 34.0 pg   MCHC 32.8 30.0 - 36.0 g/dL   RDW 14.9 11.5 - 15.5 %   Platelets 115 (L) 150 - 400 K/uL   nRBC 0.0 0.0 - 0.2 %   Neutrophils Relative % 73 %   Neutro Abs 1.1 (L) 1.7 - 7.7 K/uL   Lymphocytes Relative 23 %   Lymphs Abs 0.3 (L) 0.7 - 4.0 K/uL   Monocytes Relative 2 %   Monocytes Absolute 0.0 (L) 0.1 - 1.0 K/uL   Eosinophils Relative 0 %   Eosinophils Absolute 0.0 0.0 - 0.5 K/uL   Basophils Relative 1 %   Basophils Absolute 0.0 0.0 - 0.1 K/uL   WBC Morphology INCREASED BANDS (>20% BANDS)    Immature Granulocytes 1 %   Abs Immature Granulocytes 0.01 0.00 - 0.07 K/uL  Comprehensive metabolic panel     Status: Abnormal   Collection Time: 12/28/22  3:20 AM  Result Value Ref Range   Sodium 135 135 - 145 mmol/L   Potassium 3.3 (L) 3.5 - 5.1 mmol/L   Chloride 95 (L) 98 - 111 mmol/L   CO2 22 22 - 32 mmol/L   Glucose, Bld 96 70 - 99 mg/dL   BUN 42 (H) 6 - 20 mg/dL   Creatinine, Ser 3.88 (H) 0.44 - 1.00 mg/dL   Calcium 8.3 (L) 8.9 - 10.3 mg/dL   Total Protein 7.5 6.5 - 8.1 g/dL   Albumin 3.5 3.5 - 5.0 g/dL   AST 44 (H) 15 - 41 U/L   ALT 19 0 - 44 U/L   Alkaline Phosphatase 49 38 - 126 U/L   Total Bilirubin 0.6 0.3 - 1.2 mg/dL   GFR, Estimated 13 (L) >60 mL/min   Anion gap 18 (H) 5 - 15  Lipase, blood     Status: Abnormal   Collection Time: 12/28/22  3:20 AM   Result Value Ref Range   Lipase 10 (L) 11 - 51 U/L  CK     Status: Abnormal   Collection Time: 12/28/22  3:20 AM  Result Value Ref Range   Total CK 722 (H) 38 - 234 U/L   Imaging Studies: DG Chest Port 1 View  Result Date: 12/28/2022 CLINICAL DATA:  Influenza.  Nausea, vomiting, and diarrhea. EXAM: PORTABLE CHEST 1 VIEW COMPARISON:  12/25/2022. FINDINGS: The  heart size and mediastinal contours are within normal limits. Patchy airspace disease is noted in the mid to lower lung fields bilaterally, greater on the right than on the left. No effusion or pneumothorax. No acute osseous abnormality. IMPRESSION: Patchy airspace disease in the mid to lower lung fields bilaterally, concerning for pneumonia. Electronically Signed   By: Brett Fairy M.D.   On: 12/28/2022 03:31    ED COURSE and MDM  Nursing notes, initial and subsequent vitals signs, including pulse oximetry, reviewed and interpreted by myself.  Vitals:   12/28/22 0315 12/28/22 0321 12/28/22 0330 12/28/22 0347  BP: 97/76 106/82 110/77   Pulse: (!) 109 (!) 110 (!) 107   Resp: '17 17 20   '$ Temp:      SpO2: 93% 92% 95% 91%  Weight:      Height:       Medications  promethazine (PHENERGAN) 25 MG/ML injection (  Not Given 12/28/22 0347)  cefTRIAXone (ROCEPHIN) 1 g in sodium chloride 0.9 % 100 mL IVPB (has no administration in time range)  doxycycline (VIBRA-TABS) tablet 100 mg (has no administration in time range)  potassium chloride 10 mEq in 100 mL IVPB (has no administration in time range)  lactated ringers bolus 1,000 mL (1,000 mLs Intravenous New Bag/Given 12/28/22 0340)  promethazine (PHENERGAN) 25 mg in sodium chloride 0.9 % 50 mL IVPB (0 mg Intravenous Stopped 12/28/22 0345)  dicyclomine (BENTYL) injection 20 mg (20 mg Intramuscular Given 12/28/22 0329)  pantoprazole (PROTONIX) injection 40 mg (40 mg Intravenous Given 12/28/22 0329)  ketorolac (TORADOL) 15 MG/ML injection 15 mg (15 mg Intravenous Given 12/28/22 0329)   3:38 AM Patient  noted to be hypoxic, placed on supplemental oxygen.  Chest x-ray shows bilateral lower lobe infiltrates.  Rocephin and doxycycline ordered for pneumonia (patient allergic to clarithromycin so we will give doxycycline instead of azithromycin).  Patient's white blood cell count is 1.5 which is low and down from 5.8 three days ago.  Her platelets are still low at 115 but up from 85 three days ago.  Her hemoglobin is 11.5, up from 8.9 three days ago which suggests hemoconcentration due to dehydration.  4:24 AM Patient's BUN and creatinine have elevated significantly in the past three days indicating an acute kidney injury.  We will have her admitted for multiple acute medical problems including pneumonia with hypoxia, gastroenteritis with dehydration and AKI, leukopenia and thrombocytopenia.Marland Kitchen  4:40 AM Dr. Nevada Crane accepts for admission to [facility] hospitalist service.  5:21 AM Nursing staff unable to obtain two sets of blood cultures due to patient's poor veins, likely due in part to her heroin abuse.  PROCEDURES  Procedures CRITICAL CARE Performed by: Karen Chafe Yacqub Baston Total critical care time: 30 minutes Critical care time was exclusive of separately billable procedures and treating other patients. Critical care was necessary to treat or prevent imminent or life-threatening deterioration. Critical care was time spent personally by me on the following activities: development of treatment plan with patient and/or surrogate as well as nursing, discussions with consultants, evaluation of patient's response to treatment, examination of patient, obtaining history from patient or surrogate, ordering and performing treatments and interventions, ordering and review of laboratory studies, ordering and review of radiographic studies, pulse oximetry and re-evaluation of patient's condition.   ED DIAGNOSES     ICD-10-CM   1. Community acquired pneumonia, bilateral  J18.9     2. Influenza  J11.1      3. Nausea vomiting and diarrhea  R11.2  R19.7     4. Dehydration  E86.0     5. Hypoxia  R09.02     6. Thrombocytopenia ([place])  D69.6     7. Leukopenia, unspecified type  D72.819     8. Hypokalemia due to excessive gastrointestinal loss of potassium  E87.6     9. AKI (acute kidney injury) ([place])  N17.9          Otilio Groleau, MD 12/28/22 [id number]

## 2022-12-28 NOTE — ED Notes (Signed)
Abigail Butts, RT at bedside. Placed on 2L oxygen via [place].

## 2022-12-28 NOTE — Significant Event (Signed)
Rapid Response Event Note  Assisted RN with pt transport to 4M (Order received prior to my arrival). VSS, pt on NRB with spO2 90-94%. No distress noted, questionable spO2 pleth, pt c/o pain to back and abd but denies SOB. Pt placed on monitor and situated to new room.   End [id number]  Sherilyn Dacosta, RN

## 2022-12-28 NOTE — ED Notes (Signed)
RT called to bedside for desaturation and respiratory assessment. Pt respiratory status stable w/no distress noted. RN placed pt on Powers 2 Lpm w/out increase in sats. Pt mouth breathing, was placed on Simple mask 6 Lpm w/sats of 92% at this time. MD [place] aware. RT will continue to monitor while at [facility] ED.

## 2022-12-28 NOTE — ED Notes (Signed)
Attempted to call report x1. Requesting to call back in 20 minutes.

## 2022-12-28 NOTE — Progress Notes (Signed)
Patient refused CPT at this time wanting to sleep.

## 2022-12-28 NOTE — ED Notes (Signed)
Daughter, [name], leaving at this time. Requesting to be called once room assigned and pt being transported at [phone]

## 2022-12-28 NOTE — ED Notes (Signed)
[facility] at bedside, reports provided to [facility] and tx papers. RN, Beverlee Nims, given report on 3700.

## 2022-12-28 NOTE — Consult Note (Signed)
NAME:  Bonnie Douglas, MRN:  [id number], DOB:  19-Dec-1965, LOS: 0 ADMISSION DATE:  12/28/2022, CONSULTATION DATE:  12/28/22 REFERRING MD:  Dr. Fuller Plan - TRH, CHIEF COMPLAINT:  Hypoxia    History of Present Illness:  Bonnie Douglas  is a 57yo female with a PMH significant for HTN, depression, fibromyalgia, prior heroin abuse now on Methadone, and tobacco abuse who presented to the ED 3/3 for complaints of dyspnea, N/V/D, and weakness that began 4-5 days prior to admit. Due to new oxygen demand she was admitted per [facility] for treatment of PNA   Of note she was seen in the ED 2/29 with similar symptoms and found to be Flu A positive but seen stable for discharge home.    Mid afternoon of admission PCCM was consulted for assistance in management given increased supplemental oxygen needs and increased work of breathing.   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 rate 20, height '5\' 9"'$  (1.753 m), weight 94.5 kg, SpO2 94 %.    FiO2 (%):  [44 %] 44 %   Intake/Output Summary (Last 24 hours) at 12/28/2022 1139 Last data filed at 12/28/2022 0719 Gross per 24 hour  Intake 1250 ml  Output --  Net 1250 ml   Filed Weights   12/28/22 0253 12/28/22 0852  Weight: 86.2 kg 94.5 kg    Examination: General: Acute ill appearing middle aged female lying in bed, in NAD HEENT: [place]/AT, MM pink/moist, PERRL,  Neuro: Alert and oriented x3, non-focal  CV: s1s2 regular rate and rhythm, no murmur, rubs, or gallops,  PULM:  Course breath sounds bilaterally, shallow respirations, on NRB, pulmonary splinting  GI: soft, bowel sounds  active in all 4 quadrants, non-tender, non-distended Extremities: warm/dry, no edema  Skin: no rashes or lesions  Resolved Hospital Problem list     Assessment & Plan:  Acute hypoxic respiratory failure in the setting of Flu A pneumonia  -PCR swab positive for Flu A 2/29 Hx of heroin use now on methadone  P: Move to ICU for close monitoring Likely large component pulmonary splinting given uncontrolled pain Resume home Methadone  IV Ceftriaxone  Start steroids  Resume home Robaxin  Head of bed elevated 30 degrees. Follow intermittent chest x-ray and ABG.   Ensure adequate pulmonary hygiene  Follow cultures   Acute renal injury  -Creatinine 3.88 with GFR 44 on admit, Creatinine 1.05 with GFR > 60 2/29 P: Follow renal function  Monitor urine output Trend Bmet Avoid nephrotoxins Ensure adequate renal perfusion  IV hydration Check renal US  Nausea, vomiting, diarrhea P: Supportive care  IV hydration  Antiemetics   Hypokalemia  Hypocalcemia P: Trend Bmet  Supplement as needed   History of polysubstance use  P: Resume home methadone    Best Practice (right click and "Reselect all SmartList Selections" daily)   Diet/type: Regular consistency (see orders) DVT prophylaxis: prophylactic heparin  GI prophylaxis: PPI Lines: N/A Foley:  N/A Code Status:  full code Last date of multidisciplinary goals of care discussion: Continue to update patient daily   Labs   CBC: Recent Labs  Lab  12/25/22 1120 12/28/22 0320  WBC 5.8 1.5*  NEUTROABS 5.0 1.1*  HGB 8.9* 11.5*  HCT 28.5* 35.1*  MCV 98.6 90.2  PLT 85* 115*    Basic Metabolic Panel: Recent Labs  Lab 12/25/22 1845 12/25/22 1846 12/28/22 0320  NA 134* 134* 135  K 3.7 3.6 3.3*  CL 98 97* 95*  CO2 21* 24 22  GLUCOSE 107* 105* 96  BUN 13 13 42*  CREATININE 1.03* 1.05* 3.88*  CALCIUM 9.0 9.0 8.3*  MG 1.8  --   --    GFR: Estimated Creatinine Clearance: 19.3 mL/min (A) (by C-G formula based on SCr  of 3.88 mg/dL (H)). Recent Labs  Lab 12/25/22 1120 12/28/22 0320  WBC 5.8 1.5*    Liver Function Tests: Recent Labs  Lab 12/25/22 1846 12/28/22 0320  AST 24 44*  ALT 18 19  ALKPHOS 88 49  BILITOT 0.3 0.6  PROT 7.2 7.5  ALBUMIN 3.5 3.5   Recent Labs  Lab 12/28/22 0320  LIPASE 10*   No results for input(s): "AMMONIA" in the last 168 hours.  ABG    Component Value Date/Time   PHART 7.4 12/28/2022 1125   PCO2ART 36 12/28/2022 1125   PO2ART 99 12/28/2022 1125   HCO3 22.3 12/28/2022 1125   TCO2 24 06/09/2011 1956   ACIDBASEDEF 2.1 (H) 12/28/2022 1125   O2SAT 99.8 12/28/2022 1125     Coagulation Profile: No results for input(s): "INR", "PROTIME" in the last 168 hours.  Cardiac Enzymes: Recent Labs  Lab 12/28/22 0320  CKTOTAL 722*    HbA1C: Hgb A1c MFr Bld  Date/Time Value Ref Range Status  09/22/2012 03:17 AM 5.5 <5.7 % Final    Comment:    (NOTE)                                                                       According to the ADA Clinical Practice Recommendations for 2011, when HbA1c is used as a screening test:  >=6.5%   Diagnostic of Diabetes Mellitus           (if abnormal result is confirmed) 5.7-6.4%   Increased risk of developing Diabetes Mellitus References:Diagnosis and Classification of Diabetes Mellitus,Diabetes [id number] 1):S62-S69 and Standards of Medical Care in         Diabetes - 2011,Diabetes [id number] (Suppl 1):S11-S61.    CBG: No results for input(s): "GLUCAP" in the last 168 hours.  Review of Systems:   Please see the history of present illness. All other systems reviewed and are negative   Past Medical History:  She,  has a past medical history of Anal cancer ([place]), Arthritis, Depression, Fibromyalgia, Heroin abuse ([place]), Hypertension, and Tobacco abuse (09/22/2012).   Surgical History:   Past Surgical History:  Procedure Laterality Date   LEFT HEART CATHETERIZATION WITH CORONARY ANGIOGRAM N/A 09/22/2012    Procedure: LEFT HEART CATHETERIZATION WITH CORONARY ANGIOGRAM;  Surgeon: Troy Sine, MD;  Location: [facility] CATH LAB;  Service: Cardiovascular;  Laterality: N/A;   uterine ablation       Social History:   reports that she has been smoking cigarettes. She has been smoking an average of .5 packs per day. She does not have any smokeless tobacco history on file. She reports that  she does not currently use drugs after having used the following drugs: Marijuana. She reports that she does not drink alcohol.   Family History:  Her family history includes Heart attack in an other family member.   Allergies Allergies  Allergen Reactions   Biaxin [Clarithromycin] Nausea And Vomiting   Latex    Vicodin [Hydrocodone-Acetaminophen] Other (See Comments)    heacache     Home Medications  Prior to Admission medications   Medication Sig Start Date End Date Taking? Authorizing Provider  amLODipine (NORVASC) 5 MG tablet Take 2 tablets (10 mg total) by mouth daily. 12/25/22   Isla Pence, MD  azithromycin (ZITHROMAX) 250 MG tablet Take 1 tablet (250 mg total) by mouth daily. 04/07/21   Carlisle Cater, PA-C  benzonatate (TESSALON) 100 MG capsule Take 1 capsule (100 mg total) by mouth every 8 (eight) hours. 04/07/21   Carlisle Cater, PA-C  ibuprofen (ADVIL) 600 MG tablet Take 1 tablet (600 mg total) by mouth every 6 (six) hours as needed. 12/25/22   Isla Pence, MD  methocarbamol (ROBAXIN) 500 MG tablet Take 1 tablet (500 mg total) by mouth 2 (two) times daily. 12/25/22   Isla Pence, MD  metoprolol succinate (TOPROL-XL) 25 MG 24 hr tablet Take 1 tablet (25 mg total) by mouth daily. 12/25/22   Isla Pence, MD  ondansetron (ZOFRAN-ODT) 4 MG disintegrating tablet Take 1 tablet (4 mg total) by mouth every 8 (eight) hours as needed for nausea or vomiting. 12/25/22   Isla Pence, MD     Critical care time: NA  Everette Dimauro D. Harris, NP-C [facility] Personal contact information  can be found on Amion  If no contact or response made please call [phone] 12/28/2022, 12:12 PM

## 2022-12-28 NOTE — ED Notes (Signed)
Daughter [name], notified of pt bed assignment.

## 2022-12-28 NOTE — ED Notes (Signed)
Need UA. Pt aware, unable to void at this time.

## 2022-12-28 NOTE — ED Notes (Signed)
Chanin, RN attempted to obtain blood cultures x2. Unsuccessful. Dr. Florina Ou notified and instructed to give abx. See MAR.

## 2022-12-28 NOTE — ED Notes (Signed)
Patient remains on monitor, 6L o2 via mask, with lactated ringers infusing and maintenance fluids at 200cc/hr. Patient is sleeping on/off, states that she is feeling "a little better".

## 2022-12-28 NOTE — ED Notes (Signed)
Pt reports burning with K administration. IV site patent, clean dry. Flushed with saline without difficulty or burning. Dr. Florina Ou notified and order for NS to go with K provided. See MAR.

## 2022-12-28 NOTE — ED Notes (Signed)
Pt states to this RT that she continues to smoke at this time. RT educated pt on the importance of smoking cessation and potential damage being done to her lungs. Pt also educated on multiple resources available for helping her quit. Pt verbalizes understanding at this time.

## 2022-12-28 NOTE — ED Notes (Signed)
Called [facility] -- informed that the patient Bed Assignment is READY

## 2022-12-28 NOTE — Progress Notes (Signed)
RT called to Pt room, Pt was on a simple mask 12L with sats at 90%. RT placed Pt on a HFNC (salter) 13L with sats now 93%.

## 2022-12-28 NOTE — ED Notes (Addendum)
Daughter updated that patient has been transported to [address], daughter took all belongings with exception of cell phone.

## 2022-12-28 NOTE — Progress Notes (Signed)
Plan of Care Note for accepted transfer   Patient: Bonnie Douglas MRN: [id number]   DOA: 12/28/2022  Facility requesting transfer: [facility]. Requesting Provider: Dr. Florina Ou, [place] Reason for transfer: Pneumonia  Facility course: The patient is a 57 year old female, recently diagnosed with influenza A who was seen in the ER, 3 days ago, when she was diagnosed with influenza A.  Labs were unimpressive, she was discharged to home.  She returns to the ED with complaints of nausea vomiting and diarrhea.  Hypoxic on exam with O2 saturation of 88% on ambient air.  Chest x-ray showing bilateral mid to lower lobe infiltrates suggestive of pneumonia.  Lab studies notable for elevated creatinine, above baseline, 3.88 from 1.05.  WBC 1.5K from 5.8K, 3 days ago.  The patient was started on empiric IV antibiotics Rocephin and doxycycline, she is allergic to clarithromycin.  She also received 1 L IV fluid bolus LR x 1.  The patient was admitted to telemetry medical unit as inpatient status.  Plan of care: The patient is accepted for admission to Telemetry unit, at [facility].  Inpatient status.  Author: Kayleen Memos, DO 12/28/2022  Check [email or web address] for on-call coverage.  Nursing staff, Please call [place] number on Amion as soon as patient's arrival, so appropriate admitting provider can evaluate the pt.

## 2022-12-28 NOTE — Progress Notes (Signed)
[place] Progress Note Patient Name: Bonnie Douglas DOB: 1966-08-26 MRN: [id number]   Date of Service  12/28/2022  HPI/Events of Note  Notified of patient complaint of back and chest pain graded 10/10.  Pt states tylenol does not help.  Pt has had this pain for about 4 days.  She has been given robaxin.    Crea 3.88 <-- 1.05  eICU Interventions  Get EKG.  Add on troponin.  Tramadol '50mg'$  q12hrs PRN given A[place].      Intervention Category Intermediate Interventions: Pain - evaluation and management  Elsie Lincoln 12/28/2022, 8:07 PM  12:50 AM Troponin at 9.  EKG with no significant ST-T segment changes.  On reassessment, pt is sleeping comfortably.   Plan> Continue to monitor.

## 2022-12-28 NOTE — ED Notes (Signed)
Writer attempted to obtain blood cultures x2. Was able to obtain one aerobic BC from R hand and walked to lab. Unable to obtain additional blood work. Charge RN, [place] notified and to bedside to attempt. Dr. Florina Ou aware.

## 2022-12-29 ENCOUNTER — Inpatient Hospital Stay (HOSPITAL_COMMUNITY): Payer: No Typology Code available for payment source

## 2022-12-29 ENCOUNTER — Encounter (HOSPITAL_COMMUNITY): Payer: Self-pay | Admitting: Internal Medicine

## 2022-12-29 DIAGNOSIS — R609 Edema, unspecified: Secondary | ICD-10-CM

## 2022-12-29 DIAGNOSIS — A419 Sepsis, unspecified organism: Secondary | ICD-10-CM | POA: Diagnosis not present

## 2022-12-29 DIAGNOSIS — R0602 Shortness of breath: Secondary | ICD-10-CM | POA: Diagnosis not present

## 2022-12-29 LAB — BASIC METABOLIC PANEL
Anion gap: 12 (ref 5–15)
BUN: 51 mg/dL — ABNORMAL HIGH (ref 6–20)
CO2: 21 mmol/L — ABNORMAL LOW (ref 22–32)
Calcium: 7.7 mg/dL — ABNORMAL LOW (ref 8.9–10.3)
Chloride: 99 mmol/L (ref 98–111)
Creatinine, Ser: 2.8 mg/dL — ABNORMAL HIGH (ref 0.44–1.00)
GFR, Estimated: 19 mL/min — ABNORMAL LOW (ref >60)
Glucose, Bld: 108 mg/dL — ABNORMAL HIGH (ref 70–99)
Potassium: 4 mmol/L (ref 3.5–5.1)
Sodium: 132 mmol/L — ABNORMAL LOW (ref 135–145)

## 2022-12-29 LAB — TROPONIN I (HIGH SENSITIVITY): Troponin I (High Sensitivity): 9 ng/L (ref <18)

## 2022-12-29 LAB — GLUCOSE, CAPILLARY: Glucose-Capillary: 109 mg/dL — ABNORMAL HIGH (ref 70–99)

## 2022-12-29 LAB — CBC
HCT: 27.4 % — ABNORMAL LOW (ref 36.0–46.0)
Hemoglobin: 9.4 g/dL — ABNORMAL LOW (ref 12.0–15.0)
MCH: 30 pg (ref 26.0–34.0)
MCHC: 34.3 g/dL (ref 30.0–36.0)
MCV: 87.5 fL (ref 80.0–100.0)
Platelets: 102 10*3/uL — ABNORMAL LOW (ref 150–400)
RBC: 3.13 MIL/uL — ABNORMAL LOW (ref 3.87–5.11)
RDW: 15.2 % (ref 11.5–15.5)
WBC: 1.6 10*3/uL — ABNORMAL LOW (ref 4.0–10.5)
nRBC: 0 % (ref 0.0–0.2)

## 2022-12-29 LAB — MAGNESIUM: Magnesium: 1.4 mg/dL — ABNORMAL LOW (ref 1.7–2.4)

## 2022-12-29 LAB — LACTIC ACID, PLASMA
Lactic Acid, Venous: 1.3 mmol/L (ref 0.5–1.9)
Lactic Acid, Venous: 1.2 mmol/L (ref 0.5–1.9)

## 2022-12-29 MED ORDER — CALCIUM GLUCONATE-NACL 2-0.675 GM/100ML-% IV SOLN
2.0000 g | Freq: Once | INTRAVENOUS | Status: AC
  Administered 2022-12-29: 2000 mg via INTRAVENOUS
  Filled 2022-12-29: qty 100

## 2022-12-29 MED ORDER — NICOTINE 21 MG/24HR TD PT24
21.0000 mg | MEDICATED_PATCH | Freq: Every day | TRANSDERMAL | Status: DC
  Filled 2022-12-29 (×6): qty 1

## 2022-12-29 MED ORDER — MAGNESIUM SULFATE 2 GM/50ML IV SOLN
2.0000 g | Freq: Once | INTRAVENOUS | Status: AC
  Administered 2022-12-29: 2 g via INTRAVENOUS
  Filled 2022-12-29: qty 50

## 2022-12-29 MED ORDER — ONDANSETRON HCL 4 MG/2ML IJ SOLN: 4.0000 mg | Freq: Three times a day (TID) | INTRAMUSCULAR | Status: DC | PRN

## 2022-12-29 MED ORDER — PREDNISONE 20 MG PO TABS
40.0000 mg | ORAL_TABLET | Freq: Every day | ORAL | Status: AC
  Administered 2022-12-30 – 2023-01-02 (×4): 40 mg via ORAL
  Filled 2022-12-29 (×4): qty 2

## 2022-12-29 MED ORDER — SODIUM CHLORIDE 3 % IN NEBU
4.0000 mL | INHALATION_SOLUTION | Freq: Two times a day (BID) | RESPIRATORY_TRACT | Status: AC
  Administered 2022-12-29 – 2022-12-31 (×6): 4 mL via RESPIRATORY_TRACT
  Filled 2022-12-29 (×7): qty 4

## 2022-12-29 MED ORDER — ORAL CARE MOUTH RINSE: 15.0000 mL | OROMUCOSAL | Status: DC | PRN

## 2022-12-29 MED ORDER — SODIUM CHLORIDE 0.9 % IV SOLN
2.0000 g | INTRAVENOUS | Status: AC
  Administered 2022-12-29 – 2023-01-01 (×4): 2 g via INTRAVENOUS
  Filled 2022-12-29 (×4): qty 20

## 2022-12-29 MED ORDER — MAGNESIUM SULFATE 2 GM/50ML IV SOLN: 2.0000 g | Freq: Once | INTRAVENOUS | Status: DC

## 2022-12-29 NOTE — Progress Notes (Signed)
Patient transferred via wheelchair to 2W31. Report called in to [place], RN prior to transfer. Patient alert and oriented at time of transfer.

## 2022-12-29 NOTE — TOC Initial Note (Signed)
Transition of Care [facility]) - Initial/Assessment Note    Patient Details  Name: Bonnie Douglas MRN: [id number] Date of Birth: 27-Nov-1965  [facility] CM/SW Contact:    Curlene Labrum, RN Phone Number: 12/29/2022, 4:19 PM  Clinical Narrative:                 CM called and spoke with the patient's daughter, [name] by phone.  The patient was visiting with the daughter for the past 3 months and plans to stay in the [place] care at this time.  The patient was initially living in [place].  The patient has no DME at the home at this time.  The patient is a currently smoker and plans to quit smoking - resources for cessation were included in the discharge instructions.  The patient has no PCP in the area and was set up with [facility] for January 14, 2023 at 9:50 am.  The daughter states that she drives and is able to provide the patient with transportation to appointments.  CM will continue to follow the patient for [facility] needs to return home with the daughter.  Expected Discharge Plan: Home/Self Care Barriers to Discharge: Continued Medical Work up   Patient Goals and CMS Choice Patient states their goals for this hospitalization and ongoing recovery are:: To return home with daughter CMS Medicare.gov Compare Post Acute Care list provided to:: Patient Represenative (must comment) (patient's daughter [name] [id number]) Choice offered to / list presented to : Adult [place] ownership interest in [facility].provided to:: Adult Children    Expected Discharge Plan and Services   Discharge Planning Services: CM Consult Post Acute Care Choice: Resumption of Svcs/PTA Provider Living arrangements for the past 2 months: Apartment (Patient currently living with her daughter at [address] [place], [place] [address])                                      Prior Living Arrangements/Services Living arrangements for the  past 2 months: Apartment (Patient currently living with her daughter at [address] [place], [place] [address]) Lives with:: Adult Children Patient language and need for interpreter reviewed:: Yes Do you feel safe going back to the place where you live?: Yes      Need for Family Participation in Patient Care: Yes (Comment) Care giver support system in place?: Yes (comment)   Criminal Activity/Legal Involvement Pertinent to Current Situation/Hospitalization: No - Comment as needed  Activities of Daily Living      Permission Sought/Granted Permission sought to share information with : Case Manager, Family Supports, PCP Permission granted to share information with : Yes, Verbal Permission Granted     Permission granted to share info w AGENCY: PCP follow up at [facility] granted to share info w Relationship: daughter     Emotional Assessment Appearance:: Appears stated age Attitude/Demeanor/Rapport: Gracious Affect (typically observed): Accepting Orientation: : Oriented to Self, Oriented to Place, Oriented to  Time, Oriented to Situation Alcohol / Substance Use: Tobacco Use Psych Involvement: No (comment)  Admission diagnosis:  Dehydration [E86.0] Pneumonia [J18.9] Thrombocytopenia (HCC) [D69.6] Hypoxia [R09.02] AKI (acute kidney injury) ([place]) [N17.9] Nausea vomiting and diarrhea [R11.2, R19.7] Influenza [J11.1] Leukopenia, unspecified type [D72.819] Community acquired pneumonia, bilateral [J18.9] Hypokalemia due to excessive gastrointestinal loss of potassium [E87.6] Patient Active Problem List   Diagnosis Date Noted  Sepsis due to pneumonia ([place]) 12/28/2022   Acute respiratory failure with hypoxia ([place]) 12/28/2022   Acute renal failure (ARF) ([place]) 12/28/2022   Influenza A 12/28/2022   Hypokalemia 12/28/2022   Hypocalcemia 12/28/2022   Nausea, vomiting, and diarrhea 12/28/2022   History of substance abuse ([place]) 12/28/2022   Thrombocytopenia ([place])  12/28/2022   PMDD (premenstrual dysphoric disorder) 09/01/2013   Cannabis abuse 09/01/2013   MDD (major depressive disorder), recurrent, severe, 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   Chest pain, with multiple risk factors for CAD 09/22/2012   Tobacco abuse 09/22/2012   Family history of premature CAD 09/22/2012   PCP:  System, Provider Not In Pharmacy:   [id number] [place] [place]Phone: [phone]: [phone] [id number] [address] [id number] [id number]: [phone]: [phone]   Social Determinants of Health (SDOH) Social History: SDOH Screenings   Tobacco Use: High Risk (12/28/2022)   SDOH Interventions:     Readmission Risk Interventions    12/29/2022    4:19 PM  Readmission Risk Prevention Plan  Transportation Screening Complete  PCP or Specialist Appt within 5-7 Days Complete  Home Care Screening Complete  Medication Review (RN CM) Complete

## 2022-12-29 NOTE — Progress Notes (Signed)
Patients Sp02=85% on 5lpm. Increased oxygen to high flow at 10lpm with Sp02=93%. Patient has no c/o shortness of breath.

## 2022-12-29 NOTE — Progress Notes (Signed)
NAME:  Bonnie Douglas, MRN:  [id number], DOB:  09-Mar-1966, LOS: 1 ADMISSION DATE:  12/28/2022, CONSULTATION DATE:  12/28/22 REFERRING MD:  Dr. 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 Due to new oxygen demand she was admitted per [facility] for treatment of PNA   Recently seen in the ED on 2/29 with similar symptoms and found to be Flu A positive; stable, d/c'd  home.     Mid afternoon on 3/3 PCCM was consulted for assistance in management given increased supplemental oxygen needs and increased work of breathing.   Pertinent  Medical History  HTN Depression Fibromyalgia Hx heroin abuse now on Methadone Tobacco abuse   Significant Hospital Events: Including procedures, antibiotic start and stop dates in addition to other pertinent events   3/3 admission to the ICU for Flu A, hypoxia   Interim History / Subjective:  Improvement in breathing effort Denies nausea, vomiting, diarrhea today Unable to cough, not able to expectorate but chest tightness Pain controlled  Objective   Blood pressure 121/87, pulse (!) 107, temperature (!) 97.4 F (36.3 C), temperature source Oral, resp. rate 18, height '5\' 9"'$  (1.753 m), weight 94.5 kg, SpO2 98 %. On 2L [place]        Intake/Output Summary (Last 24 hours) at 12/29/2022 0735 Last data filed at 12/29/2022 0500 Gross per 24 hour  Intake 1364.02 ml  Output 925 ml  Net 439.02 ml   Filed Weights   12/28/22 0253 12/28/22 0852  Weight: 86.2 kg 94.5 kg    Examination: General: acutely ill woman laying in bed with nasal cannula, in NAD HENT: MMM, nasal canula present Lungs: speaking in full sentences, decreased air movement, ronchi present throughout, no wheezing on  auscultation Cardiovascular: tachycardia, no murmurs Abdomen: BS, non tender, non distended Extremities: warm and dry, no LE edea Neuro: Awake, alert, and oriented GU: No suprapubic tenderness  Significant labs and imaging WBC 1.6 from 1.5 Hgb 9.4 from 11.5 BUN 51, Cr 2.80 from 3.88 and GFR 13  Sputum culture: none available to be sent Blood culture NG <12HR MRSA PCR neg  Legionella pending Procalcitonin 51.51 Lactic acid 3.3 ->2.8  CXR bibasilar airspace opacities R>L, trace L pleural effusion Renal US - without CD dilation, distended bladder Resolved Hospital Problem list     Assessment & Plan:  Acute hypoxic respiratory failure Sepsis secondary to Flu A PNA  PCR positive 2/29 Improvement, now on 2L [place] Started on Ceftriaxone 2g daily until 2/7 Doxycycline daily until 2/7 Osetalmivir 30 mg daily until 2/7 Transition from solumedrol to prednisone 40 mg until 2/7 Chest PT/ Flutter/Guafenesin Trial of hypertonic saline nebulizer Encourage mobilization off bed  AKI Metabolic acidosis, improving No prior hx of CKD Likely pre-renal in the setting of acute infection On admission 3.88 GFR 13 S/p 3x 1L LR boluses Improved today at Cr 2.8, GFR 19 Increased UOP, not at goal yet F/u bladder scan after voiding trial, cath is retaining On Maintenace at 100 cc./hr, now d/c Trend renal function and urine output F/u lactic acid  Nausea/ vomiting/diarrhea, resolved C/b hypokalemia, supplemented 2/2 to Flu A on treatment per above Low PO intake, will monitor off of fluids Supportive care  Anemia Leukopenia Thrombocytopenia In setting of infection No signs of hemorrhage Continue to monitor  Hx polysubstance use 35 pack year history of smoking -on methadone 30 mg daily -Nicotine patch  Chronic pain - Methadone - Robaxin 500 mg BIUD  Dispo -patient stable and able to return to [facility] tomorrow AM  Best Practice (right click and "Reselect all SmartList Selections"  daily)   Diet/type: Regular consistency (see orders) DVT prophylaxis: prophylactic heparin  GI prophylaxis: PPI Lines: N/A Foley:  N/A Code Status:  full code Last date of multidisciplinary goals of care discussion : Patient in agreement with plan  Labs   CBC: Recent Labs  Lab 12/25/22 1120 12/28/22 0320 12/29/22 0117  WBC 5.8 1.5* 1.6*  NEUTROABS 5.0 1.1*  --   HGB 8.9* 11.5* 9.4*  HCT 28.5* 35.1* 27.4*  MCV 98.6 90.2 87.5  PLT 85* 115* 102*    Basic Metabolic Panel: Recent Labs  Lab 12/25/22 1845 12/25/22 1846 12/28/22 0320 12/29/22 0117  NA 134* 134* 135 132*  K 3.7 3.6 3.3* 4.0  CL 98 97* 95* 99  CO2 21* 24 22 21*  GLUCOSE 107* 105* 96 108*  BUN 13 13 42* 51*  CREATININE 1.03* 1.05* 3.88* 2.80*  CALCIUM 9.0 9.0 8.3* 7.7*  MG 1.8  --   --  1.4*   GFR: Estimated Creatinine Clearance: 26.8 mL/min (A) (by C-G formula based on SCr of 2.8 mg/dL (H)). Recent Labs  Lab 12/25/22 1120 12/28/22 0320 12/28/22 1329 12/28/22 1746 12/29/22 0117  PROCALCITON  --   --  51.51  --   --   WBC 5.8 1.5*  --   --  1.6*  LATICACIDVEN  --   --  3.3* 2.8*  --     Liver Function Tests: Recent Labs  Lab 12/25/22 1846 12/28/22 0320  AST 24 44*  ALT 18 19  ALKPHOS 88 49  BILITOT 0.3 0.6  PROT 7.2 7.5  ALBUMIN 3.5 3.5   Recent Labs  Lab 12/28/22 0320  LIPASE 10*   No results for input(s): "AMMONIA" in the last 168 hours.  ABG    Component Value Date/Time   PHART 7.4 12/28/2022 1125   PCO2ART 36 12/28/2022 1125   PO2ART 99 12/28/2022 1125   HCO3 22.3 12/28/2022 1125   TCO2 24 06/09/2011 1956   ACIDBASEDEF 2.1 (H) 12/28/2022 1125   O2SAT 99.8 12/28/2022 1125     Coagulation Profile: No results for input(s): "INR", "PROTIME" in the last 168 hours.  Cardiac Enzymes: Recent Labs  Lab 12/28/22 0320  CKTOTAL 722*    HbA1C: Hgb A1c MFr Bld  Date/Time Value Ref Range Status  09/22/2012 03:17 AM 5.5 <5.7 % Final    Comment:    (NOTE)                                                                        According to the ADA Clinical Practice Recommendations for 2011, when HbA1c is used as a screening test:  >=6.5%   Diagnostic of Diabetes Mellitus           (if abnormal result is confirmed) 5.7-6.4%   Increased risk of developing Diabetes Mellitus References:Diagnosis and Classification of Diabetes Mellitus,Diabetes Care,2011,34(Suppl 1):S62-S69 and Standards  of Medical Care in         Diabetes - 2011,Diabetes Care,2011,34 (Suppl 1):S11-S61.    CBG: Recent Labs  Lab 12/28/22 1216 12/28/22 1904 12/29/22 0301  GLUCAP 83 106* 109*    Review of Systems:   Per above  Past Medical History:  She,  has a past medical history of Anal cancer ([place]), Arthritis, Depression, Fibromyalgia, Heroin abuse ([place]), Hypertension, and Tobacco abuse (09/22/2012).   Surgical History:   Past Surgical History:  Procedure Laterality Date   LEFT HEART CATHETERIZATION WITH CORONARY ANGIOGRAM N/A 09/22/2012   Procedure: LEFT HEART CATHETERIZATION WITH CORONARY ANGIOGRAM;  Surgeon: [place] Sine, MD;  Location: [facility] CATH LAB;  Service: Cardiovascular;  Laterality: N/A;   uterine ablation       Social History:   reports that she has been smoking cigarettes. She has been smoking an average of .5 packs per day. She does not have any smokeless tobacco history on file. She reports that she does not currently use drugs after having used the following drugs: Marijuana. She reports that she does not drink alcohol.   Family History:  Her family history includes Heart attack in an other family member.   Allergies Allergies  Allergen Reactions   Biaxin [Clarithromycin] Nausea And Vomiting   Latex    Vicodin [Hydrocodone-Acetaminophen] Other (See Comments)    heacache     Home Medications  Prior to Admission medications   Medication Sig Start Date End Date Taking? Authorizing Provider  amLODipine (NORVASC) 5 MG tablet Take 2 tablets (10 mg total) by mouth  daily. 12/25/22   Isla Pence, MD  azithromycin (ZITHROMAX) 250 MG tablet Take 1 tablet (250 mg total) by mouth daily. 04/07/21   Carlisle Cater, PA-C  benzonatate (TESSALON) 100 MG capsule Take 1 capsule (100 mg total) by mouth every 8 (eight) hours. 04/07/21   Carlisle Cater, PA-C  ibuprofen (ADVIL) 600 MG tablet Take 1 tablet (600 mg total) by mouth every 6 (six) hours as needed. 12/25/22   Isla Pence, MD  methocarbamol (ROBAXIN) 500 MG tablet Take 1 tablet (500 mg total) by mouth 2 (two) times daily. 12/25/22   Isla Pence, MD  metoprolol succinate (TOPROL-XL) 25 MG 24 hr tablet Take 1 tablet (25 mg total) by mouth daily. 12/25/22   Isla Pence, MD  ondansetron (ZOFRAN-ODT) 4 MG disintegrating tablet Take 1 tablet (4 mg total) by mouth every 8 (eight) hours as needed for nausea or vomiting. 12/25/22   Isla Pence, MD     Critical care time: 55 min    Romana Juniper, MD [facility] Internal Medicine Program - PGY-1 12/29/2022, 8:56 AM

## 2022-12-29 NOTE — Progress Notes (Signed)
Incentive spirometry was introduced with teach back.

## 2022-12-29 NOTE — Progress Notes (Signed)
Patient received from the ICU via wheelchair at 1500 pm. Patient is alert and oriented

## 2022-12-29 NOTE — Progress Notes (Signed)
Bilateral lower extremity venous study completed.   Preliminary results relayed to [facility], RN.  Please see CV Procedures for preliminary results.  Candie Chroman, RVT  9:36 AM 12/29/22

## 2022-12-29 NOTE — Progress Notes (Signed)
[place] Progress Note Patient Name: Bonnie Douglas DOB: [birth year] MRN: [id number]   Date of Service  12/29/2022  HPI/Events of Note  Notified of Mg at 1.4, crea 2.80, K 4.0.   eICU Interventions  Replete Mg - 2g IV Magnesium sulfate ordered.      Intervention Category Minor Interventions: Electrolytes abnormality - evaluation and management  Elsie Lincoln 12/29/2022, 5:07 AM

## 2022-12-30 ENCOUNTER — Encounter (HOSPITAL_COMMUNITY): Payer: Self-pay | Admitting: Internal Medicine

## 2022-12-30 DIAGNOSIS — J101 Influenza due to other identified influenza virus with other respiratory manifestations: Secondary | ICD-10-CM | POA: Diagnosis not present

## 2022-12-30 DIAGNOSIS — N179 Acute kidney failure, unspecified: Secondary | ICD-10-CM | POA: Diagnosis not present

## 2022-12-30 DIAGNOSIS — F1911 Other psychoactive substance abuse, in remission: Secondary | ICD-10-CM | POA: Diagnosis not present

## 2022-12-30 DIAGNOSIS — J9601 Acute respiratory failure with hypoxia: Secondary | ICD-10-CM | POA: Diagnosis not present

## 2022-12-30 LAB — CBC
HCT: 29.1 % — ABNORMAL LOW (ref 36.0–46.0)
Hemoglobin: 9.5 g/dL — ABNORMAL LOW (ref 12.0–15.0)
MCH: 29.2 pg (ref 26.0–34.0)
MCHC: 32.6 g/dL (ref 30.0–36.0)
MCV: 89.5 fL (ref 80.0–100.0)
Platelets: 138 10*3/uL — ABNORMAL LOW (ref 150–400)
RBC: 3.25 MIL/uL — ABNORMAL LOW (ref 3.87–5.11)
RDW: 15.6 % — ABNORMAL HIGH (ref 11.5–15.5)
WBC: 11.2 10*3/uL — ABNORMAL HIGH (ref 4.0–10.5)
nRBC: 0.3 % — ABNORMAL HIGH (ref 0.0–0.2)

## 2022-12-30 LAB — BASIC METABOLIC PANEL
Anion gap: 11 (ref 5–15)
BUN: 49 mg/dL — ABNORMAL HIGH (ref 6–20)
CO2: 23 mmol/L (ref 22–32)
Calcium: 8.8 mg/dL — ABNORMAL LOW (ref 8.9–10.3)
Chloride: 100 mmol/L (ref 98–111)
Creatinine, Ser: 1.32 mg/dL — ABNORMAL HIGH (ref 0.44–1.00)
GFR, Estimated: 47 mL/min — ABNORMAL LOW (ref >60)
Glucose, Bld: 84 mg/dL (ref 70–99)
Potassium: 3.2 mmol/L — ABNORMAL LOW (ref 3.5–5.1)
Sodium: 134 mmol/L — ABNORMAL LOW (ref 135–145)

## 2022-12-30 LAB — LEGIONELLA PNEUMOPHILA SEROGP 1 UR AG: L. pneumophila Serogp 1 Ur Ag: NEGATIVE

## 2022-12-30 LAB — MAGNESIUM: Magnesium: 2.6 mg/dL — ABNORMAL HIGH (ref 1.7–2.4)

## 2022-12-30 MED ORDER — INFLUENZA VAC SPLIT QUAD 0.5 ML IM SUSY
0.5000 mL | PREFILLED_SYRINGE | INTRAMUSCULAR | Status: AC
  Administered 2023-01-03: 0.5 mL via INTRAMUSCULAR
  Filled 2022-12-30: qty 0.5

## 2022-12-30 MED ORDER — PNEUMOCOCCAL 20-VAL CONJ VACC 0.5 ML IM SUSY
0.5000 mL | PREFILLED_SYRINGE | INTRAMUSCULAR | Status: AC
  Administered 2023-01-03: 0.5 mL via INTRAMUSCULAR
  Filled 2022-12-30 (×2): qty 0.5

## 2022-12-30 NOTE — Progress Notes (Addendum)
  Progress Note   Patient: Bonnie Douglas [id number] DOB: Mar 09, 1966 DOA: 12/28/2022     2 DOS: the patient was seen and examined on 12/30/2022   Brief hospital course: 57 year old woman PMH including smoker, previous heroin use now on methadone, recent visit to the ED for influenza, presented with nausea, vomiting, diarrhea, myalgias, cough.  Hypoxic in the emergency department.  Admitted for acute hypoxic respiratory failure, influenza A, AKI, sepsis secondary to pneumonia. Started on IVF and tamiflu and doxycycline.  Developed ARDS type picture and was transferred to the critical care service for worsening respiratory distress.  Assessment and Plan: Acute hypoxic respiratory failure Severe Sepsis (with AKI, elevated lactate) secondary to Flu A PNA  Possible bacterial pneumonia superimposed with markedly elevated procalcitonin Feels better but continues to require high oxygen currently at 10 L.  Wean oxygen as tolerated. Continue empiric ceftriaxone Finish Osetalmivir   Finish prednisone Chest PT/ Flutter/Guafenesin   AKI Metabolic acidosis  No prior hx of CKD, likely pre-renal in the setting of acute infection On admission 3.88.  Baseline unknown. Improved somewhat yesterday, repeat BMP pending for today.  Labs are delayed.  Urine output is adequate.  Renal ultrasound was unrevealing.   Normocytic anemia anemia Leukopenia Thrombocytopenia Secondary to acute infection, influenza.  Trend CBC.  Repeat labs delayed.   PMH heroin, polysubstance use 35 pack year history of smoking Continue chronic methadone. Nicotine patch "I will stop smoking"   Chronic pain Methadone Robaxin  Nausea/ vomiting/diarrhea, resolved Hypokalemia, resolved     Subjective:  Feels better, "about 50% better" No pain No n/v Some coughing  Physical Exam: Vitals:   12/30/22 0417 12/30/22 0543 12/30/22 0820 12/30/22 0907  BP: (!) 162/99 (!) 160/93  (!) 158/104  Pulse: 98 99 85 90  Resp: '16 16 17    '$ Temp: 98.9 F (37.2 C) 98.2 F (36.8 C)    TempSrc:      SpO2: 95% 93% 96%   Weight:      Height:       Physical Exam Vitals reviewed.  Constitutional:      General: She is not in acute distress.    Appearance: She is not ill-appearing or toxic-appearing.  Cardiovascular:     Rate and Rhythm: Normal rate and regular rhythm.     Heart sounds: No murmur heard. Pulmonary:     Effort: Pulmonary effort is normal. No respiratory distress.     Breath sounds: No wheezing, rhonchi or rales.  Neurological:     Mental Status: She is alert.  Psychiatric:        Mood and Affect: Mood normal.        Behavior: Behavior normal.     Data Reviewed: CBG stable Creatinine 3.88 > 2.8 >  Hgb 11.5 > 9.4 >   Family Communication: none  Disposition: Status is: Inpatient Remains inpatient appropriate because: hypoxia, flu  Planned Discharge Destination: Home    Time spent: 35 minutes  Author: Murray Hodgkins, MD 12/30/2022 9:33 AM  For on call review [email or web address].

## 2022-12-31 DIAGNOSIS — J9601 Acute respiratory failure with hypoxia: Secondary | ICD-10-CM | POA: Diagnosis not present

## 2022-12-31 LAB — BASIC METABOLIC PANEL
Anion gap: 10 (ref 5–15)
BUN: 39 mg/dL — ABNORMAL HIGH (ref 6–20)
CO2: 25 mmol/L (ref 22–32)
Calcium: 8.9 mg/dL (ref 8.9–10.3)
Chloride: 102 mmol/L (ref 98–111)
Creatinine, Ser: 1 mg/dL (ref 0.44–1.00)
GFR, Estimated: >60 mL/min (ref >60)
Glucose, Bld: 83 mg/dL (ref 70–99)
Potassium: 3.2 mmol/L — ABNORMAL LOW (ref 3.5–5.1)
Sodium: 137 mmol/L (ref 135–145)

## 2022-12-31 LAB — CBC
HCT: 28.6 % — ABNORMAL LOW (ref 36.0–46.0)
Hemoglobin: 9.6 g/dL — ABNORMAL LOW (ref 12.0–15.0)
MCH: 29.8 pg (ref 26.0–34.0)
MCHC: 33.6 g/dL (ref 30.0–36.0)
MCV: 88.8 fL (ref 80.0–100.0)
Platelets: 155 10*3/uL (ref 150–400)
RBC: 3.22 MIL/uL — ABNORMAL LOW (ref 3.87–5.11)
RDW: 15.8 % — ABNORMAL HIGH (ref 11.5–15.5)
WBC: 16.4 10*3/uL — ABNORMAL HIGH (ref 4.0–10.5)
nRBC: 0.3 % — ABNORMAL HIGH (ref 0.0–0.2)

## 2022-12-31 LAB — MAGNESIUM: Magnesium: 2.2 mg/dL (ref 1.7–2.4)

## 2022-12-31 MED ORDER — OSELTAMIVIR PHOSPHATE 75 MG PO CAPS
75.0000 mg | ORAL_CAPSULE | Freq: Every day | ORAL | Status: AC
  Administered 2023-01-01: 75 mg via ORAL
  Filled 2022-12-31: qty 1

## 2022-12-31 MED ORDER — POTASSIUM CHLORIDE CRYS ER 20 MEQ PO TBCR
40.0000 meq | EXTENDED_RELEASE_TABLET | Freq: Once | ORAL | Status: AC
  Administered 2022-12-31: 40 meq via ORAL
  Filled 2022-12-31: qty 2

## 2022-12-31 MED ORDER — OSELTAMIVIR PHOSPHATE 30 MG PO CAPS
30.0000 mg | ORAL_CAPSULE | Freq: Once | ORAL | Status: AC
  Administered 2022-12-31: 30 mg via ORAL
  Filled 2022-12-31: qty 1

## 2022-12-31 NOTE — Progress Notes (Signed)
Received pt in the bed, on room air with O2 sat at 83%. Placed on O2 up to 5L. Dr. Myna Hidalgo was notified.     12/31/22 2039  Vitals  Temp 98.4 F (36.9 C)  Temp Source Oral  BP (!) 164/101 (Dr. Myna Hidalgo notified)  MAP (mmHg) 120  BP Location Right Arm  BP Method Automatic  Patient Position (if appropriate) Lying  ECG Heart Rate 86  Resp 18  MEWS COLOR  MEWS Score Color Green  Oxygen Therapy  SpO2 96 %  O2 Device Nasal Cannula  O2 Flow Rate (L/min) 5 L/min  Pain Assessment  Pain Scale 0-10  Pain Score 10  Pain Type Chronic pain  Pain Location Back  Pain Orientation Lower;Mid  Pain Descriptors / Indicators Aching;Sharp  Pain Frequency Constant  Pain Onset On-going  Patients Stated Pain Goal 0  Pain Intervention(s) Medication (See eMAR)  Multiple Pain Sites No  MEWS Score  MEWS Temp 0  MEWS Systolic 0  MEWS Pulse 0  MEWS RR 0  MEWS LOC 0  MEWS Score 0

## 2022-12-31 NOTE — Progress Notes (Signed)
PROGRESS NOTE    Bonnie Douglas  [id number] DOB: 30-Dec-1965 DOA: 12/28/2022 PCP: System, Provider Not In  Chief Complaint  Patient presents with   N/V/D    Brief Narrative:   57 year old woman PMH including smoker, previous heroin use now on methadone, recent visit to the ED for influenza, presented with nausea, vomiting, diarrhea, myalgias, cough.  Hypoxic in the emergency department.  Admitted for acute hypoxic respiratory failure, influenza Bonnie Douglas, AKI, sepsis secondary to pneumonia. Started on IVF and tamiflu and doxycycline.  Developed ARDS type picture and was transferred to the critical care service for worsening respiratory distress.    Assessment & Plan:   Principal Problem:   Acute respiratory failure with hypoxia (HCC) Active Problems:   Sepsis due to pneumonia ([place])   Influenza Bonnie Douglas   AKI (acute kidney injury) (HCC)   Nausea, vomiting, and diarrhea   Hypokalemia   Hypocalcemia   History of substance abuse (HCC)   Thrombocytopenia (HCC)  Acute hypoxic respiratory failure Severe Sepsis (with AKI, elevated lactate) secondary to Flu Bonnie Douglas PNA  Possible bacterial pneumonia superimposed with markedly elevated procalcitonin Feels better but continues to require high oxygen currently at 10 L.  Wean oxygen as tolerated.  Transferred to progressive today due to continued high O2 needs (after transfer, RN able to wean to 1 L, monitor for stability) Continue empiric ceftriaxone Finish Osetalmivir   Finish prednisone Chest PT/ Flutter/Guafenesin Seems to be improving, will follow   AKI Metabolic acidosis  No prior hx of CKD, likely pre-renal in the setting of acute infection On admission 3.88.  Baseline unknown. Improved somewhat yesterday, repeat BMP pending for today.  Labs are delayed.  Urine output is adequate.  Renal ultrasound was unrevealing.   Normocytic anemia anemia Leukopenia Thrombocytopenia Improved platelets today, will trend anemia and now leukocytosis (related to  steroids)   PMH heroin, polysubstance use 35 pack year history of smoking Continue chronic methadone. Nicotine patch "I will stop smoking" Would benefit from outpatient PFT's and lung cancer screening   Chronic pain Methadone Robaxin   Nausea/ vomiting/diarrhea, resolved Hypokalemia, resolved    DVT prophylaxis: heparin Code Status: full Family Communication: none Disposition:   Status is: Inpatient Remains inpatient appropriate because: pending improvement in O2 sats   Consultants:  PCCM  Procedures:  LE [place] Summary:  BILATERAL:  - No evidence of deep vein thrombosis seen in the lower extremities,  bilaterally.  - No evidence of superficial venous thrombosis in the lower extremities,  bilaterally.  -No evidence of popliteal cyst, bilaterally.   Antimicrobials:  Anti-infectives (From admission, onward)    Start     Dose/Rate Route Frequency Ordered Stop   01/01/23 1000  oseltamivir (TAMIFLU) capsule 75 mg        75 mg Oral Daily 12/31/22 1412 01/02/23 0959   12/31/22 1500  oseltamivir (TAMIFLU) capsule 30 mg        30 mg Oral  Once 12/31/22 1412     12/29/22 1000  cefTRIAXone (ROCEPHIN) 2 g in sodium chloride 0.9 % 100 mL IVPB  Status:  Discontinued        2 g 200 mL/hr over 30 Minutes Intravenous Every 24 hours 12/28/22 0906 12/28/22 1143   12/29/22 1000  cefTRIAXone (ROCEPHIN) 2 g in sodium chloride 0.9 % 100 mL IVPB  Status:  Discontinued        2 g 200 mL/hr over 30 Minutes Intravenous Every 24 hours 12/28/22 1143 12/29/22 0845   12/29/22 1000  cefTRIAXone (ROCEPHIN)  2 g in sodium chloride 0.9 % 100 mL IVPB        2 g 200 mL/hr over 30 Minutes Intravenous Every 24 hours 12/29/22 0845 01/02/23 0959   12/28/22 2200  doxycycline (VIBRAMYCIN) 100 mg in sodium chloride 0.9 % 250 mL IVPB  Status:  Discontinued        100 mg 125 mL/hr over 120 Minutes Intravenous Every 12 hours 12/28/22 0906 12/29/22 1052   12/28/22 1300  oseltamivir (TAMIFLU) capsule 30 mg   Status:  Discontinued        30 mg Oral Daily 12/28/22 1200 12/31/22 1412   12/28/22 1000  cefTRIAXone (ROCEPHIN) 1 g in sodium chloride 0.9 % 100 mL IVPB        1 g 200 mL/hr over 30 Minutes Intravenous  Once 12/28/22 0906 12/28/22 1044   12/28/22 0345  cefTRIAXone (ROCEPHIN) 1 g in sodium chloride 0.9 % 100 mL IVPB        1 g 200 mL/hr over 30 Minutes Intravenous  Once 12/28/22 0337 12/28/22 0528   12/28/22 0345  doxycycline (VIBRA-TABS) tablet 100 mg        100 mg Oral  Once 12/28/22 0337 12/28/22 0459       Subjective: No new complaints - frustrated  Objective: Vitals:   12/31/22 1155 12/31/22 1200 12/31/22 1257 12/31/22 1500  BP:      Pulse: 88   91  Resp: 20     Temp: 98.7 F (37.1 C)     TempSrc: Oral     SpO2: 95% 96% 96% 90%  Weight:      Height:        Intake/Output Summary (Last 24 hours) at 12/31/2022 1726 Last data filed at 12/31/2022 0400 Gross per 24 hour  Intake 280 ml  Output 700 ml  Net -420 ml   Filed Weights   12/28/22 0253 12/28/22 0852  Weight: 86.2 kg 94.5 kg    Examination:  General exam: Appears calm and comfortable  Respiratory system: frequent cough, limited inspiration, no clear adventitious lung sounds Cardiovascular system: RRR Gastrointestinal system: Abdomen is nondistended, soft and nontender.  Central nervous system: Alert and oriented. No focal neurological deficits. Extremities: no LEE   Data Reviewed: I have personally reviewed following labs and imaging studies  CBC: Recent Labs  Lab 12/25/22 1120 12/28/22 0320 12/29/22 0117 12/30/22 1227 12/31/22 0816  WBC 5.8 1.5* 1.6* 11.2* 16.4*  NEUTROABS 5.0 1.1*  --   --   --   HGB 8.9* 11.5* 9.4* 9.5* 9.6*  HCT 28.5* 35.1* 27.4* 29.1* 28.6*  MCV 98.6 90.2 87.5 89.5 88.8  PLT 85* 115* 102* 138* [id number]    Basic Metabolic Panel: Recent Labs  Lab 12/25/22 1845 12/25/22 1846 12/28/22 0320 12/29/22 0117 12/30/22 1227 12/31/22 0816  NA 134* 134* 135 132* 134* 137  K 3.7  3.6 3.3* 4.0 3.2* 3.2*  CL 98 97* 95* 99 100 102  CO2 21* 24 22 21* 23 25  GLUCOSE 107* 105* 96 108* 84 83  BUN 13 13 42* 51* 49* 39*  CREATININE 1.03* 1.05* 3.88* 2.80* 1.32* 1.00  CALCIUM 9.0 9.0 8.3* 7.7* 8.8* 8.9  MG 1.8  --   --  1.4* 2.6* 2.2    GFR: Estimated Creatinine Clearance: 75 mL/min (by C-G formula based on SCr of 1 mg/dL).  Liver Function Tests: Recent Labs  Lab 12/25/22 1846 12/28/22 0320  AST 24 44*  ALT 18 19  ALKPHOS 88 49  BILITOT  0.3 0.6  PROT 7.2 7.5  ALBUMIN 3.5 3.5    CBG: Recent Labs  Lab 12/28/22 1216 12/28/22 1904 12/29/22 0301  GLUCAP 83 106* 109*     Recent Results (from the past 240 hour(s))  Resp panel by RT-PCR (RSV, Flu Charvi Gammage&B, Covid) Anterior Nasal Swab     Status: Abnormal   Collection Time: 12/25/22 11:11 AM   Specimen: Anterior Nasal Swab  Result Value Ref Range Status   SARS Coronavirus 2 by RT PCR NEGATIVE NEGATIVE Final   Influenza Xzavier Swinger by PCR POSITIVE (Nayelli Inglis) NEGATIVE Final   Influenza B by PCR NEGATIVE NEGATIVE Final    Comment: (NOTE) The Xpert Xpress SARS-CoV-2/FLU/RSV plus assay is intended as an aid in the diagnosis of influenza from Nasopharyngeal swab specimens and should not be used as Johnye Kist sole basis for treatment. Nasal washings and aspirates are unacceptable for Xpert Xpress SARS-CoV-2/FLU/RSV testing.  Fact Sheet for Patients: EntrepreneurPulse.com.au  Fact Sheet for Healthcare Providers: IncredibleEmployment.be  This test is not yet approved or cleared by the [place] FDA and has been authorized for detection and/or diagnosis of SARS-CoV-2 by FDA under an Emergency Use Authorization (EUA). This EUA will remain in effect (meaning this test can be used) for the duration of the COVID-19 declaration under Section 564(b)(1) of the Act, 21 U.S.C. section 360bbb-3(b)(1), unless the authorization is terminated or revoked.     Resp Syncytial Virus by PCR NEGATIVE NEGATIVE Final     Comment: (NOTE) Fact Sheet for Patients: EntrepreneurPulse.com.au  Fact Sheet for Healthcare Providers: IncredibleEmployment.be  This test is not yet approved or cleared by the [place] FDA and has been authorized for detection and/or diagnosis of SARS-CoV-2 by FDA under an Emergency Use Authorization (EUA). This EUA will remain in effect (meaning this test can be used) for the duration of the COVID-19 declaration under Section 564(b)(1) of the Act, 21 U.S.C. section 360bbb-3(b)(1), unless the authorization is terminated or revoked.  Performed at [facility], [place] [address], [place], [place] [address]   Blood culture (routine x 2)     Status: None (Preliminary result)   Collection Time: 12/28/22  4:15 AM   Specimen: BLOOD  Result Value Ref Range Status   Specimen Description   Final    BLOOD BLOOD RIGHT HAND Performed at [facility], [address], [place], [place] [address]    Special Requests   Final    BOTTLES DRAWN AEROBIC ONLY Blood Culture results may not be optimal due to an inadequate volume of blood received in culture bottles Performed at [facility], [address] [place], [place] [address]    Culture   Final    NO GROWTH 3 DAYS Performed at [facility], [place] [address], [place], [place] [address]    Report Status PENDING  Incomplete  MRSA Next Gen by PCR, Nasal     Status: None   Collection Time: 12/28/22  1:31 PM   Specimen: Nasal Mucosa; Nasal Swab  Result Value Ref Range Status   MRSA by PCR Next Gen NOT DETECTED NOT DETECTED Final    Comment: (NOTE) The GeneXpert MRSA Assay (FDA approved for NASAL specimens only), is one component of Jayce Boyko comprehensive MRSA colonization surveillance program. It is not intended to diagnose MRSA infection nor to guide or monitor treatment for MRSA infections. Test performance is not FDA approved in patients less than 101  years old. Performed at [facility], [place] [address], [place], [place]  [place]          Radiology Studies: No results found.      Scheduled Meds:  guaiFENesin  1,200 mg Oral BID   heparin  5,000 Units Subcutaneous Q8H   influenza vac split quadrivalent PF  0.5 mL Intramuscular Tomorrow-1000   methadone  30 mg Oral Daily   methocarbamol  500 mg Oral BID   nicotine  21 mg Transdermal Daily   oseltamivir  30 mg Oral Once   [START ON 01/01/2023] oseltamivir  75 mg Oral Daily   pneumococcal 20-valent conjugate vaccine  0.5 mL Intramuscular Tomorrow-1000   predniSONE  40 mg Oral Q breakfast   sodium chloride HYPERTONIC  4 mL Nebulization BID   Continuous Infusions:  cefTRIAXone (ROCEPHIN)  IV Stopped (12/31/22 1253)     LOS: 3 days    Time spent: over 30 min    Fayrene Helper, MD [facility]   To contact the attending provider between 7A-7P or the covering provider during after hours 7P-7A, please log into the web site [email or web address] and access using universal [place] password for that web site. If you do not have the password, please call the hospital operator.  12/31/2022, 5:26 PM

## 2022-12-31 NOTE — Progress Notes (Signed)
PHARMACY NOTE:  ANTIMICROBIAL RENAL DOSAGE ADJUSTMENT  Current antimicrobial regimen includes a mismatch between antimicrobial dosage and estimated renal function.  As per policy approved by the Pharmacy & Therapeutics and Medical Executive Committees, the antimicrobial dosage will be adjusted accordingly.  Current antimicrobial dosage:  Tamiflu '30mg'$  qday  Indication: Flu  Renal Function:  Estimated Creatinine Clearance: 75 mL/min (by C-G formula based on SCr of 1 mg/dL). '[]'$      On intermittent HD, scheduled: '[]'$      On CRRT    Antimicrobial dosage has been changed to:  Tamiflu '75mg'$  qday  Additional comments:   Onnie Boer, PharmD, BCIDP, AAHIVP, CPP Infectious Disease Pharmacist 12/31/2022 2:13 PM

## 2023-01-01 DIAGNOSIS — J9601 Acute respiratory failure with hypoxia: Secondary | ICD-10-CM | POA: Diagnosis not present

## 2023-01-01 LAB — BASIC METABOLIC PANEL
Anion gap: 9 (ref 5–15)
BUN: 32 mg/dL — ABNORMAL HIGH (ref 6–20)
CO2: 25 mmol/L (ref 22–32)
Calcium: 8.8 mg/dL — ABNORMAL LOW (ref 8.9–10.3)
Chloride: 105 mmol/L (ref 98–111)
Creatinine, Ser: 0.91 mg/dL (ref 0.44–1.00)
GFR, Estimated: >60 mL/min (ref >60)
Glucose, Bld: 81 mg/dL (ref 70–99)
Potassium: 3.6 mmol/L (ref 3.5–5.1)
Sodium: 139 mmol/L (ref 135–145)

## 2023-01-01 LAB — CBC
HCT: 26.6 % — ABNORMAL LOW (ref 36.0–46.0)
Hemoglobin: 8.8 g/dL — ABNORMAL LOW (ref 12.0–15.0)
MCH: 29.5 pg (ref 26.0–34.0)
MCHC: 33.1 g/dL (ref 30.0–36.0)
MCV: 89.3 fL (ref 80.0–100.0)
Platelets: 162 10*3/uL (ref 150–400)
RBC: 2.98 MIL/uL — ABNORMAL LOW (ref 3.87–5.11)
RDW: 15.8 % — ABNORMAL HIGH (ref 11.5–15.5)
WBC: 18.5 10*3/uL — ABNORMAL HIGH (ref 4.0–10.5)
nRBC: 0.4 % — ABNORMAL HIGH (ref 0.0–0.2)

## 2023-01-01 LAB — MAGNESIUM: Magnesium: 2.1 mg/dL (ref 1.7–2.4)

## 2023-01-01 MED ORDER — METOPROLOL SUCCINATE ER 25 MG PO TB24
25.0000 mg | ORAL_TABLET | Freq: Every day | ORAL | Status: DC
  Administered 2023-01-01 – 2023-01-08 (×8): 25 mg via ORAL
  Filled 2023-01-01 (×8): qty 1

## 2023-01-01 MED ORDER — AMLODIPINE BESYLATE 10 MG PO TABS
10.0000 mg | ORAL_TABLET | Freq: Every day | ORAL | Status: DC
  Administered 2023-01-01 – 2023-01-08 (×8): 10 mg via ORAL
  Filled 2023-01-01 (×8): qty 1

## 2023-01-01 NOTE — Progress Notes (Addendum)
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  Hypoxic in the emergency department.  Admitted for acute hypoxic respiratory failure, influenza Bonnie Douglas, AKI, sepsis secondary to pneumonia. Started on IVF and tamiflu and doxycycline.  Developed ARDS type picture and was transferred to the critical care service for worsening respiratory distress.    Assessment & Plan:   Principal Problem:   Acute respiratory failure with hypoxia (HCC) Active Problems:   Sepsis due to pneumonia ([name])   Influenza Bonnie Douglas   AKI (acute kidney injury) (HCC)   Nausea, vomiting, and diarrhea   Hypokalemia   Hypocalcemia   History of substance abuse (HCC)   Thrombocytopenia (HCC)  Acute hypoxic respiratory failure Severe Sepsis (with AKI, elevated lactate) secondary to Flu Bonnie Douglas PNA  Possible bacterial pneumonia superimposed with markedly elevated procalcitonin Currently requiring 6 L Continue empiric ceftriaxone Completed Osetalmivir   Finish prednisone Chest PT/ Flutter/Guafenesin Seems to be improving, will follow - low threshold for CT imaging given slow improvement    AKI Metabolic acidosis  No prior hx of CKD, likely pre-renal in the setting of acute infection On admission 3.88.  Baseline unknown. resolved   Normocytic anemia anemia Leukopenia Thrombocytopenia Resolved thormbocytopenia   PMH heroin, polysubstance use 35 pack year history of smoking Continue chronic methadone. Nicotine patch "I will stop smoking" Would benefit from outpatient PFT's and lung cancer screening   Chronic pain Methadone Robaxin   Nausea/ vomiting/diarrhea, resolved Hypokalemia, resolved  Hypertension Restart  amlodipine Looks like not taking metoprolol prior to admission? Will review     DVT prophylaxis: heparin  Code Status: full Family Communication: none Disposition:   Status is: Inpatient Remains inpatient appropriate because: continued O2 needs   Consultants:  PCCM  Procedures:  LE [place] Summary:  BILATERAL:  - No evidence of deep vein thrombosis seen in the lower extremities,  bilaterally.  - No evidence of superficial venous thrombosis in the lower extremities,  bilaterally.  -No evidence of popliteal cyst, bilaterally.   Antimicrobials:  Anti-infectives (From admission, onward)    Start     Dose/Rate Route Frequency Ordered Stop   01/01/23 1000  oseltamivir (TAMIFLU) capsule 75 mg        75 mg Oral Daily 12/31/22 1412 01/01/23 0938   12/31/22 1500  oseltamivir (TAMIFLU) capsule 30 mg        30 mg Oral  Once 12/31/22 1412 12/31/22 1749   12/29/22 1000  cefTRIAXone (ROCEPHIN) 2 g in sodium chloride 0.9 % 100 mL IVPB  Status:  Discontinued        2 g 200 mL/hr over 30 Minutes Intravenous Every 24 hours 12/28/22 0906 12/28/22 1143   12/29/22 1000  cefTRIAXone (ROCEPHIN) 2 g in sodium chloride 0.9 % 100 mL IVPB  Status:  Discontinued        2 g 200 mL/hr over 30 Minutes Intravenous Every 24 hours 12/28/22 1143 12/29/22 0845   12/29/22 1000  cefTRIAXone (ROCEPHIN) 2 g in sodium chloride 0.9 % 100 mL IVPB        2 g 200 mL/hr over 30 Minutes Intravenous Every 24 hours 12/29/22 0845 01/01/23 1007   12/28/22 2200  doxycycline (VIBRAMYCIN) 100 mg in sodium  chloride 0.9 % 250 mL IVPB  Status:  Discontinued        100 mg 125 mL/hr over 120 Minutes Intravenous Every 12 hours 12/28/22 0906 12/29/22 1052   12/28/22 1300  oseltamivir (TAMIFLU) capsule 30 mg  Status:  Discontinued        30 mg Oral Daily 12/28/22 1200 12/31/22 1412   12/28/22 1000  cefTRIAXone (ROCEPHIN) 1 g in sodium chloride 0.9 % 100 mL IVPB        1 g 200 mL/hr over 30 Minutes Intravenous  Once 12/28/22 0906  12/28/22 1044   12/28/22 0345  cefTRIAXone (ROCEPHIN) 1 g in sodium chloride 0.9 % 100 mL IVPB        1 g 200 mL/hr over 30 Minutes Intravenous  Once 12/28/22 0337 12/28/22 0528   12/28/22 0345  doxycycline (VIBRA-TABS) tablet 100 mg        100 mg Oral  Once 12/28/22 0337 12/28/22 0459       Subjective: No complaints today Pleuritic pain has resolved  Objective: Vitals:   01/01/23 0513 01/01/23 0656 01/01/23 0806 01/01/23 1100  BP: (!) 161/98  (!) 149/95 (!) 143/85  Pulse:   88 92  Resp: '16  18 18  '$ Temp:   (!) 97.4 F (36.3 C) 98 F (36.7 C)  TempSrc:   Oral Oral  SpO2: 97% 92% 93% 94%  Weight:      Height:       No intake or output data in the 24 hours ending 01/01/23 1505 Filed Weights   12/28/22 0253 12/28/22 0852  Weight: 86.2 kg 94.5 kg    Examination:  General: No acute distress. Cardiovascular: RRR Lungs: coarse cough, diminished Abdomen: Soft, nontender, nondistended  Neurological: Alert and oriented 3. Moves all extremities 4 with equal strength. Cranial nerves II through XII grossly intact. Extremities: No clubbing or cyanosis. No edema.   Data Reviewed: I have personally reviewed following labs and imaging studies  CBC: Recent Labs  Lab 12/28/22 0320 12/29/22 0117 12/30/22 1227 12/31/22 0816 01/01/23 0253  WBC 1.5* 1.6* 11.2* 16.4* 18.5*  NEUTROABS 1.1*  --   --   --   --   HGB 11.5* 9.4* 9.5* 9.6* 8.8*  HCT 35.1* 27.4* 29.1* 28.6* 26.6*  MCV 90.2 87.5 89.5 88.8 89.3  PLT 115* 102* 138* 155 0000000    Basic Metabolic Panel: Recent Labs  Lab 12/25/22 1845 12/25/22 1846 12/28/22 0320 12/29/22 0117 12/30/22 1227 12/31/22 0816 01/01/23 0253  NA 134*   < > 135 132* 134* 137 139  K 3.7   < > 3.3* 4.0 3.2* 3.2* 3.6  CL 98   < > 95* 99 100 102 105  CO2 21*   < > 22 21* '23 25 25  '$ GLUCOSE 107*   < > 96 108* 84 83 81  BUN 13   < > 42* 51* 49* 39* 32*  CREATININE 1.03*   < > 3.88* 2.80* 1.32* 1.00 0.91  CALCIUM 9.0   < > 8.3* 7.7* 8.8* 8.9  8.8*  MG 1.8  --   --  1.4* 2.6* 2.2 2.1   < > = values in this interval not displayed.    GFR: Estimated Creatinine Clearance: 82.4 mL/min (by C-G formula based on SCr of 0.91 mg/dL).  Liver Function Tests: Recent Labs  Lab 12/25/22 1846 12/28/22 0320  AST 24 44*  ALT 18 19  ALKPHOS 88 49  BILITOT 0.3 0.6  PROT 7.2 7.5  ALBUMIN 3.5  3.5    CBG: Recent Labs  Lab 12/28/22 1216 12/28/22 1904 12/29/22 0301  GLUCAP 83 106* 109*     Recent Results (from the past 240 hour(s))  Resp panel by RT-PCR (RSV, Flu Keylah Darwish&B, Covid) Anterior Nasal Swab     Status: Abnormal   Collection Time: 12/25/22 11:11 AM   Specimen: Anterior Nasal Swab  Result Value Ref Range Status   SARS Coronavirus 2 by RT PCR NEGATIVE NEGATIVE Final   Influenza Eissa Buchberger by PCR POSITIVE (Samara Stankowski) NEGATIVE Final   Influenza B by PCR NEGATIVE NEGATIVE Final    Comment: (NOTE) The Xpert Xpress SARS-CoV-2/FLU/RSV plus assay is intended as an aid in the diagnosis of influenza from Nasopharyngeal swab specimens and should not be used as Kerly Rigsbee sole basis for treatment. Nasal washings and aspirates are unacceptable for Xpert Xpress SARS-CoV-2/FLU/RSV testing.  Fact Sheet for Patients: EntrepreneurPulse.com.au  Fact Sheet for Healthcare Providers: IncredibleEmployment.be  This test is not yet approved or cleared by the [place] FDA and has been authorized for detection and/or diagnosis of SARS-CoV-2 by FDA under an Emergency Use Authorization (EUA). This EUA will remain in effect (meaning this test can be used) for the duration of the COVID-19 declaration under Section 564(b)(1) of the Act, 21 U.S.C. section 360bbb-3(b)(1), unless the authorization is terminated or revoked.     Resp Syncytial Virus by PCR NEGATIVE NEGATIVE Final    Comment: (NOTE) Fact Sheet for Patients: EntrepreneurPulse.com.au  Fact Sheet for Healthcare  Providers: IncredibleEmployment.be  This test is not yet approved or cleared by the [place] FDA and has been authorized for detection and/or diagnosis of SARS-CoV-2 by FDA under an Emergency Use Authorization (EUA). This EUA will remain in effect (meaning this test can be used) for the duration of the COVID-19 declaration under Section 564(b)(1) of the Act, 21 U.S.C. section 360bbb-3(b)(1), unless the authorization is terminated or revoked.  Performed at [facility], [place] [address], [place], [place] [address]   Blood culture (routine x 2)     Status: None (Preliminary result)   Collection Time: 12/28/22  4:15 AM   Specimen: BLOOD  Result Value Ref Range Status   Specimen Description   Final    BLOOD BLOOD RIGHT HAND Performed at [facility], [address], [place], [place] [address]    Special Requests   Final    BOTTLES DRAWN AEROBIC ONLY Blood Culture results may not be optimal due to an inadequate volume of blood received in culture bottles Performed at [facility], [address], [place], [place] [address]    Culture   Final    NO GROWTH 4 DAYS Performed at [facility], [place] [address], [place], [place] [address]    Report Status PENDING  Incomplete  MRSA Next Gen by PCR, Nasal     Status: None   Collection Time: 12/28/22  1:31 PM   Specimen: Nasal Mucosa; Nasal Swab  Result Value Ref Range Status   MRSA by PCR Next Gen NOT DETECTED NOT DETECTED Final    Comment: (NOTE) The GeneXpert MRSA Assay (FDA approved for NASAL specimens only), is one component of Loida Calamia comprehensive MRSA colonization surveillance program. It is not intended to diagnose MRSA infection nor to guide or monitor treatment for MRSA infections. Test performance is not FDA approved in patients less than 70 years old. Performed at [facility], [place] [address], [place], [place] [address]  Radiology  Studies: No results found.      Scheduled Meds:  amLODipine  10 mg Oral Daily   guaiFENesin  1,200 mg Oral BID   heparin  5,000 Units Subcutaneous Q8H   influenza vac split quadrivalent PF  0.5 mL Intramuscular Tomorrow-1000   methadone  30 mg Oral Daily   methocarbamol  500 mg Oral BID   nicotine  21 mg Transdermal Daily   pneumococcal 20-valent conjugate vaccine  0.5 mL Intramuscular Tomorrow-1000   predniSONE  40 mg Oral Q breakfast   Continuous Infusions:   LOS: 4 days    Time spent: over 30 min    Fayrene Helper, MD [facility]   To contact the attending provider between 7A-7P or the covering provider during after hours 7P-7A, please log into the web site [email or web address] and access using universal [place] password for that web site. If you do not have the password, please call the hospital operator.  01/01/2023, 3:05 PM

## 2023-01-01 NOTE — Progress Notes (Signed)
Pt's bed alarm came off. Pt instructed to use the [facility] for safety but pt was impulsive and went to the bathroom. Pt was crying and yelling to the RN but when pt asked, pt stated I'm okay, I know how to use the call bell if I need to. Pt denies pain, denies nausea and vomiting. Pt increase demand for O2 went up to 10L from 5L. Dr. Myna Hidalgo was notified.

## 2023-01-01 NOTE — Progress Notes (Signed)
CSW received call from care management, patients sister Bonnie Douglas called wanting to speak with CSW.Patient gave CSW permission to speak with her sister. Patients sister called inquiring about medicaid and disability resources for patient. All questions answered. No further questions reported at this time.CSW spoke with patient and offered medicaid and disability resources. Patient accepted. All questions answered . No further questions at this time.

## 2023-01-01 NOTE — Plan of Care (Signed)
  Problem: Education: Goal: Knowledge of General Education information will improve Description: Including pain rating scale, medication(s)/side effects and non-pharmacologic comfort measures 01/01/2023 0821 by Emmaline Life, RN Outcome: Progressing 01/01/2023 0820 by Emmaline Life, RN Outcome: Progressing

## 2023-01-02 DIAGNOSIS — J9601 Acute respiratory failure with hypoxia: Secondary | ICD-10-CM | POA: Diagnosis not present

## 2023-01-02 LAB — BASIC METABOLIC PANEL
Anion gap: 8 (ref 5–15)
BUN: 23 mg/dL — ABNORMAL HIGH (ref 6–20)
CO2: 26 mmol/L (ref 22–32)
Calcium: 8.8 mg/dL — ABNORMAL LOW (ref 8.9–10.3)
Chloride: 103 mmol/L (ref 98–111)
Creatinine, Ser: 0.91 mg/dL (ref 0.44–1.00)
GFR, Estimated: >60 mL/min (ref >60)
Glucose, Bld: 169 mg/dL — ABNORMAL HIGH (ref 70–99)
Potassium: 3.7 mmol/L (ref 3.5–5.1)
Sodium: 137 mmol/L (ref 135–145)

## 2023-01-02 LAB — CBC
HCT: 28.4 % — ABNORMAL LOW (ref 36.0–46.0)
Hemoglobin: 8.9 g/dL — ABNORMAL LOW (ref 12.0–15.0)
MCH: 28.8 pg (ref 26.0–34.0)
MCHC: 31.3 g/dL (ref 30.0–36.0)
MCV: 91.9 fL (ref 80.0–100.0)
Platelets: 185 10*3/uL (ref 150–400)
RBC: 3.09 MIL/uL — ABNORMAL LOW (ref 3.87–5.11)
RDW: 15.9 % — ABNORMAL HIGH (ref 11.5–15.5)
WBC: 16.1 10*3/uL — ABNORMAL HIGH (ref 4.0–10.5)
nRBC: 0.3 % — ABNORMAL HIGH (ref 0.0–0.2)

## 2023-01-02 LAB — MAGNESIUM: Magnesium: 1.7 mg/dL (ref 1.7–2.4)

## 2023-01-02 LAB — CULTURE, BLOOD (ROUTINE X 2): Culture: NO GROWTH

## 2023-01-02 NOTE — Evaluation (Signed)
Physical Therapy Evaluation Patient Details Name: Bonnie Douglas MRN: [id number] DOB: 08/16/1966 Today's Date: 01/02/2023  History of Present Illness  57 y.o. female presents to [facility] hospital on 12/28/2022 with nausea, vomiting, diarrhea, myalgias and cough. Pt admitted for acute hypoxic respiratory failure, flu A, AKI, sepsis 2/2 PNA. PMH includes HTN, depression, fibromyalgia.  Clinical Impression  Pt presents to PT with deficits in power, endurance ,cardiopulmonary function. Pt is requiring 6L of supplemental oxygen to mobilize at this time, desaturating on 5L [place] with ambulation. Pt is mobilizing well overall, not requiring physical assistance. Pt denies SOB despite desaturation when ambulating. PT encourages frequent mobilization in an effort to restore independence. PT recommends discharge home when medically ready.       Recommendations for follow up therapy are one component of a multi-disciplinary discharge planning process, led by the attending physician.  Recommendations may be updated based on patient status, additional functional criteria and insurance authorization.  Follow Up Recommendations No PT follow up      Assistance Recommended at Discharge PRN  Patient can return home with the following  Assistance with cooking/housework;Help with stairs or ramp for entrance    Equipment Recommendations None recommended by PT  Recommendations for Other Services       Functional Status Assessment Patient has had a recent decline in their functional status and demonstrates the ability to make significant improvements in function in a reasonable and predictable amount of time.     Precautions / Restrictions Precautions Precautions: Other (comment) Precaution Comments: monitor SpO2 Restrictions Weight Bearing Restrictions: No      Mobility  Bed Mobility Overal bed mobility: Modified Independent                  Transfers Overall transfer level: Needs assistance Equipment  used: None Transfers: Sit to/from Stand, Bed to chair/wheelchair/BSC Sit to Stand: Supervision   Step pivot transfers: Supervision            Ambulation/Gait Ambulation/Gait assistance: Supervision Gait Distance (Feet): 150 Feet Assistive device: None Gait Pattern/deviations: Step-through pattern Gait velocity: reduced Gait velocity interpretation: <1.8 ft/sec, indicate of risk for recurrent falls   General Gait Details: slowed step-through gait  Stairs            Wheelchair Mobility    Modified Rankin (Stroke Patients Only)       Balance Overall balance assessment: Needs assistance Sitting-balance support: No upper extremity supported, Feet supported Sitting balance-Leahy Scale: Good     Standing balance support: No upper extremity supported, During functional activity Standing balance-Leahy Scale: Good                               Pertinent Vitals/Pain Pain Assessment Pain Assessment: No/denies pain    Home Living Family/patient expects to be discharged to:: Private residence Living Arrangements: Children Available Help at Discharge: Family;Available PRN/intermittently Type of Home: Apartment Home Access: Stairs to enter Entrance Stairs-Rails:  (pt is unsure) Entrance Stairs-Number of Steps: 4   Home Layout: One level Home Equipment: None Additional Comments: planning to discharge to her daughters apartment    Prior Function Prior Level of Function : Independent/Modified Independent                     Hand Dominance        Extremity/Trunk Assessment   Upper Extremity Assessment Upper Extremity Assessment: Overall WFL for tasks assessed    Lower Extremity  Assessment Lower Extremity Assessment: Generalized weakness    Cervical / Trunk Assessment Cervical / Trunk Assessment: Normal  Communication   Communication: No difficulties  Cognition Arousal/Alertness: Awake/alert Behavior During Therapy: WFL for tasks  assessed/performed Overall Cognitive Status: Within Functional Limits for tasks assessed                                          General Comments General comments (skin integrity, edema, etc.): pt on 5L [place] upon PT arrival, pt desats when mobilizing when on 4 and 5L [place]. Pt requires 6L [place] when mobilizing during session    Exercises     Assessment/Plan    PT Assessment Patient needs continued PT services  PT Problem List Decreased activity tolerance;Cardiopulmonary status limiting activity       PT Treatment Interventions DME instruction;Gait training;Functional mobility training;Stair training;Therapeutic activities;Balance training;Patient/family education    PT Goals (Current goals can be found in the Care Plan section)  Acute Rehab PT Goals Patient Stated Goal: to return to independence PT Goal Formulation: With patient Time For Goal Achievement: 01/16/23 Potential to Achieve Goals: Good Additional Goals Additional Goal #1: Pt will ambulate on room air for >250' with oxygen saturation 92% or above.    Frequency Min 3X/week     Co-evaluation               AM-PAC PT "6 Clicks" Mobility  Outcome Measure Help needed turning from your back to your side while in a flat bed without using bedrails?: None Help needed moving from lying on your back to sitting on the side of a flat bed without using bedrails?: None Help needed moving to and from a bed to a chair (including a wheelchair)?: A Little Help needed standing up from a chair using your arms (e.g., wheelchair or bedside chair)?: A Little Help needed to walk in hospital room?: A Little Help needed climbing 3-5 steps with a railing? : A Little 6 Click Score: 20    End of Session Equipment Utilized During Treatment: Oxygen Activity Tolerance: Patient tolerated treatment well Patient left: in bed;with call bell/phone within reach Nurse Communication: Mobility status PT Visit Diagnosis: Other  abnormalities of gait and mobility (R26.89)    Time: [id number] PT Time Calculation (min) (ACUTE ONLY): 12 min   Charges:   PT Evaluation $PT Eval Low Complexity: Maguayo, PT, DPT Acute Rehabilitation Office [phone]   Zenaida Niece 01/02/2023, 1:58 PM

## 2023-01-02 NOTE — Progress Notes (Addendum)
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  Hypoxic in the emergency department.  Admitted for acute hypoxic respiratory failure, influenza Bonnie Douglas, AKI, sepsis secondary to pneumonia. Started on IVF and tamiflu and doxycycline.  Developed ARDS type picture and was transferred to the critical care service for worsening respiratory distress.    Assessment & Plan:   Principal Problem:   Acute respiratory failure with hypoxia (HCC) Active Problems:   Sepsis due to pneumonia ([place])   Influenza Bonnie Douglas   AKI (acute kidney injury) ([place])   Nausea, vomiting, and diarrhea   Hypokalemia   Hypocalcemia   History of substance abuse (HCC)   Thrombocytopenia (HCC)  Acute hypoxic respiratory failure Severe Sepsis (with AKI, elevated lactate) secondary to Flu Bonnie Douglas PNA  Possible bacterial pneumonia superimposed with markedly elevated procalcitonin Currently requiring 6 L (weaned to 2 L today, but with therapy, desatted on 5 L) Continue empiric ceftriaxone Completed Osetalmivir   completed prednisone Chest PT/ Flutter/Guafenesin Seems to be improving, will follow - low threshold for CT imaging given slow improvement    AKI Metabolic acidosis  No prior hx of CKD, likely pre-renal in the setting of acute infection On admission 3.88.  Baseline unknown. resolved   Normocytic anemia anemia Leukopenia Thrombocytopenia Resolved thormbocytopenia   PMH heroin, polysubstance use 35 pack year history of smoking Continue chronic methadone. Nicotine patch "I will stop smoking" Would benefit from outpatient PFT's and lung cancer screening   Chronic pain Methadone Robaxin   Nausea/ vomiting/diarrhea,  resolved Hypokalemia, resolved  Hypertension Restart amlodipine Looks like not taking metoprolol prior to admission? Will review     DVT prophylaxis: heparin  Code Status: full Family Communication: none Disposition:   Status is: Inpatient Remains inpatient appropriate because: continued O2 needs   Consultants:  PCCM  Procedures:  LE [place] Summary:  BILATERAL:  - No evidence of deep vein thrombosis seen in the lower extremities,  bilaterally.  - No evidence of superficial venous thrombosis in the lower extremities,  bilaterally.  -No evidence of popliteal cyst, bilaterally.   Antimicrobials:  Anti-infectives (From admission, onward)    Start     Dose/Rate Route Frequency Ordered Stop   01/01/23 1000  oseltamivir (TAMIFLU) capsule 75 mg        75 mg Oral Daily 12/31/22 1412 01/01/23 0938   12/31/22 1500  oseltamivir (TAMIFLU) capsule 30 mg        30 mg Oral  Once 12/31/22 1412 12/31/22 1749   12/29/22 1000  cefTRIAXone (ROCEPHIN) 2 g in sodium chloride 0.9 % 100 mL IVPB  Status:  Discontinued        2 g 200 mL/hr over 30 Minutes Intravenous Every 24 hours 12/28/22 0906 12/28/22 1143   12/29/22 1000  cefTRIAXone (ROCEPHIN) 2 g in sodium chloride 0.9 % 100 mL IVPB  Status:  Discontinued        2 g 200 mL/hr over 30 Minutes Intravenous Every 24 hours 12/28/22 1143 12/29/22 0845   12/29/22 1000  cefTRIAXone (ROCEPHIN) 2 g in sodium chloride 0.9 % 100 mL IVPB        2 g 200 mL/hr over 30 Minutes Intravenous Every 24 hours 12/29/22 0845 01/01/23  1007   12/28/22 2200  doxycycline (VIBRAMYCIN) 100 mg in sodium chloride 0.9 % 250 mL IVPB  Status:  Discontinued        100 mg 125 mL/hr over 120 Minutes Intravenous Every 12 hours 12/28/22 0906 12/29/22 1052   12/28/22 1300  oseltamivir (TAMIFLU) capsule 30 mg  Status:  Discontinued        30 mg Oral Daily 12/28/22 1200 12/31/22 1412   12/28/22 1000  cefTRIAXone (ROCEPHIN) 1 g in sodium chloride 0.9 % 100 mL IVPB        1 g 200  mL/hr over 30 Minutes Intravenous  Once 12/28/22 0906 12/28/22 1044   12/28/22 0345  cefTRIAXone (ROCEPHIN) 1 g in sodium chloride 0.9 % 100 mL IVPB        1 g 200 mL/hr over 30 Minutes Intravenous  Once 12/28/22 0337 12/28/22 0528   12/28/22 0345  doxycycline (VIBRA-TABS) tablet 100 mg        100 mg Oral  Once 12/28/22 0337 12/28/22 0459       Subjective: Tired this AM, no new complaints  Objective: Vitals:   01/01/23 1613 01/01/23 2019 01/02/23 0525 01/02/23 0906  BP: (!) 134/95 (!) 150/89 (!) 153/97 (!) 159/103  Pulse: 97 73 72 87  Resp: '18 16 18 20  '$ Temp: (!) 97.5 F (36.4 C) 97.8 F (36.6 C) (!) 97.5 F (36.4 C) 98.1 F (36.7 C)  TempSrc: Oral Oral Oral Oral  SpO2: 93% 97% 95% 92%  Weight:      Height:        Intake/Output Summary (Last 24 hours) at 01/02/2023 1603 Last data filed at 01/01/2023 1700 Gross per 24 hour  Intake 480 ml  Output --  Net 480 ml   Filed Weights   12/28/22 0253 12/28/22 0852  Weight: 86.2 kg 94.5 kg    Examination:  General: No acute distress. Cardiovascular: RRR Lungs: Clear to auscultation bilaterally Abdomen: Soft, nontender, nondistended  Neurological: Alert and oriented 3. Moves all extremities 4 with equal strength. Cranial nerves II through XII grossly intact. Extremities: No clubbing or cyanosis. No edema.  Data Reviewed: I have personally reviewed following labs and imaging studies  CBC: Recent Labs  Lab 12/28/22 0320 12/29/22 0117 12/30/22 1227 12/31/22 0816 01/01/23 0253 01/02/23 0247  WBC 1.5* 1.6* 11.2* 16.4* 18.5* 16.1*  NEUTROABS 1.1*  --   --   --   --   --   HGB 11.5* 9.4* 9.5* 9.6* 8.8* 8.9*  HCT 35.1* 27.4* 29.1* 28.6* 26.6* 28.4*  MCV 90.2 87.5 89.5 88.8 89.3 91.9  PLT 115* 102* 138* 155 162 [id number]    Basic Metabolic Panel: Recent Labs  Lab 12/29/22 0117 12/30/22 1227 12/31/22 0816 01/01/23 0253 01/02/23 0247  NA 132* 134* 137 139 137  K 4.0 3.2* 3.2* 3.6 3.7  CL 99 100 102 105 103  CO2 21*  '23 25 25 26  '$ GLUCOSE 108* 84 83 81 169*  BUN 51* 49* 39* 32* 23*  CREATININE 2.80* 1.32* 1.00 0.91 0.91  CALCIUM 7.7* 8.8* 8.9 8.8* 8.8*  MG 1.4* 2.6* 2.2 2.1 1.7    GFR: Estimated Creatinine Clearance: 82.4 mL/min (by C-G formula based on SCr of 0.91 mg/dL).  Liver Function Tests: Recent Labs  Lab 12/28/22 0320  AST 44*  ALT 19  ALKPHOS 49  BILITOT 0.6  PROT 7.5  ALBUMIN 3.5    CBG: Recent Labs  Lab 12/28/22 1216 12/28/22 1904 12/29/22 0301  GLUCAP 83 106* 109*  Recent Results (from the past 240 hour(s))  Resp panel by RT-PCR (RSV, Flu [place]&B, Covid) Anterior Nasal Swab     Status: Abnormal   Collection Time: 12/25/22 11:11 AM   Specimen: Anterior Nasal Swab  Result Value Ref Range Status   SARS Coronavirus 2 by RT PCR NEGATIVE NEGATIVE Final   Influenza Earla Charlie by PCR POSITIVE ([place] Wrightson) NEGATIVE Final   Influenza B by PCR NEGATIVE NEGATIVE Final    Comment: (NOTE) The Xpert Xpress SARS-CoV-2/FLU/RSV plus assay is intended as an aid in the diagnosis of influenza from Nasopharyngeal swab specimens and should not be used as Sheylin Scharnhorst sole basis for treatment. Nasal washings and aspirates are unacceptable for Xpert Xpress SARS-CoV-2/FLU/RSV testing.  Fact Sheet for Patients: EntrepreneurPulse.com.au  Fact Sheet for Healthcare Providers: IncredibleEmployment.be  This test is not yet approved or cleared by the [place] FDA and has been authorized for detection and/or diagnosis of SARS-CoV-2 by FDA under an Emergency Use Authorization (EUA). This EUA will remain in effect (meaning this test can be used) for the duration of the COVID-19 declaration under Section 564(b)(1) of the Act, 21 U.S.C. section 360bbb-3(b)(1), unless the authorization is terminated or revoked.     Resp Syncytial Virus by PCR NEGATIVE NEGATIVE Final    Comment: (NOTE) Fact Sheet for Patients: EntrepreneurPulse.com.au  Fact Sheet for Healthcare  Providers: IncredibleEmployment.be  This test is not yet approved or cleared by the [place] FDA and has been authorized for detection and/or diagnosis of SARS-CoV-2 by FDA under an Emergency Use Authorization (EUA). This EUA will remain in effect (meaning this test can be used) for the duration of the COVID-19 declaration under Section 564(b)(1) of the Act, 21 U.S.C. section 360bbb-3(b)(1), unless the authorization is terminated or revoked.  Performed at [facility], [place] [address] [place], [place] [address]   Blood culture (routine x 2)     Status: None   Collection Time: 12/28/22  4:15 AM   Specimen: BLOOD  Result Value Ref Range Status   Specimen Description   Final    BLOOD BLOOD RIGHT HAND Performed at [facility], [address] [place], [place] [address]    Special Requests   Final    BOTTLES DRAWN AEROBIC ONLY Blood Culture results may not be optimal due to an inadequate volume of blood received in culture bottles Performed at [facility], [address] [place], [place] [address]    Culture   Final    NO GROWTH 5 DAYS Performed at [facility], [place] [address] [place], [place] [address]    Report Status 01/02/2023 FINAL  Final  MRSA Next Gen by PCR, Nasal     Status: None   Collection Time: 12/28/22  1:31 PM   Specimen: Nasal Mucosa; Nasal Swab  Result Value Ref Range Status   MRSA by PCR Next Gen NOT DETECTED NOT DETECTED Final    Comment: (NOTE) The GeneXpert MRSA Assay (FDA approved for NASAL specimens only), is one component of [facility] comprehensive MRSA colonization surveillance program. It is not intended to diagnose MRSA infection nor to guide or monitor treatment for MRSA infections. Test performance is not FDA approved in patients less than 10 years old. Performed at [facility], [place] [address], [place], [place] [address]          Radiology Studies: No results  found.      Scheduled Meds:  amLODipine  10 mg Oral Daily   guaiFENesin  1,200  mg Oral BID   heparin  5,000 Units Subcutaneous Q8H   influenza vac split quadrivalent PF  0.5 mL Intramuscular Tomorrow-1000   methadone  30 mg Oral Daily   methocarbamol  500 mg Oral BID   metoprolol succinate  25 mg Oral Daily   nicotine  21 mg Transdermal Daily   pneumococcal 20-valent conjugate vaccine  0.5 mL Intramuscular Tomorrow-1000   Continuous Infusions:   LOS: 5 days    Time spent: over 30 min    Fayrene Helper, MD [facility]   To contact the attending provider between 7A-7P or the covering provider during after hours 7P-7A, please log into the web site [email or web address] and access using universal  password for that web site. If you do not have the password, please call the hospital operator.  01/02/2023, 4:03 PM

## 2023-01-02 NOTE — Plan of Care (Signed)
  Problem: Education: Goal: Knowledge of General Education information will improve Description Including pain rating scale, medication(s)/side effects and non-pharmacologic comfort measures Outcome: Progressing   

## 2023-01-03 DIAGNOSIS — J9601 Acute respiratory failure with hypoxia: Secondary | ICD-10-CM | POA: Diagnosis not present

## 2023-01-03 NOTE — Progress Notes (Signed)
Patient Saturations on Room Air at Rest = 90%  Patient Saturations on [employer] while Ambulating = 84%  Patient Saturations on 2 Liters of oxygen while Ambulating = 90%  Irven [place], RN

## 2023-01-03 NOTE — Progress Notes (Signed)
PROGRESS NOTE    Bonnie Douglas  [id number] DOB: December 22, 1965 DOA: 12/28/2022 PCP: System, Provider Not In  Chief Complaint  Patient presents with   N/V/D    Brief Narrative:   57 year old woman PMH including smoker, previous heroin use now on methadone, recent visit to the ED for influenza, presented with nausea, vomiting, diarrhea, myalgias, cough.  Hypoxic in the emergency department.  Admitted for acute hypoxic respiratory failure, influenza Bonnie Douglas, AKI, sepsis secondary to pneumonia. Started on IVF and tamiflu and doxycycline.  Developed ARDS type picture and was transferred to the critical care service for worsening respiratory distress.    Assessment & Plan:   Principal Problem:   Acute respiratory failure with hypoxia (HCC) Active Problems:   Sepsis due to pneumonia (Bonnie Douglas)   Influenza Bonnie Douglas   AKI (acute kidney injury) (HCC)   Nausea, vomiting, and diarrhea   Hypokalemia   Hypocalcemia   History of substance abuse (HCC)   Thrombocytopenia (HCC)  Acute hypoxic respiratory failure Severe Sepsis (with AKI, elevated lactate) secondary to Bonnie Douglas  Possible bacterial pneumonia superimposed with markedly elevated procalcitonin Currently requiring 2 L intermittently, expect she'll need home o2 Continue empiric ceftriaxone Completed Bonnie Douglas   completed prednisone Chest PT/ Flutter/Guafenesin Seems to be improving, will follow - low threshold for CT imaging given slow improvement    AKI Metabolic acidosis  No prior hx of CKD, likely pre-renal in the setting of acute infection On admission 3.88.  Baseline unknown. resolved   Normocytic anemia anemia Leukopenia Thrombocytopenia Resolved thormbocytopenia   PMH heroin, polysubstance use 35 pack year history of smoking Continue chronic methadone. Nicotine patch "I will stop smoking" Would benefit from outpatient PFT's and lung cancer screening   Chronic pain Methadone Robaxin   Nausea/ vomiting/diarrhea,  resolved Hypokalemia, resolved  Hypertension Restart amlodipine Looks like not taking metoprolol prior to admission? Will review     DVT prophylaxis: heparin  Code Status: full Family Communication: none Disposition:   Status is: Inpatient Remains inpatient appropriate because: continued O2 needs   Consultants:  Bonnie Douglas  Procedures:  [place] Summary:  BILATERAL:  - No evidence of deep vein thrombosis seen in the lower extremities,  bilaterally.  - No evidence of superficial venous thrombosis in the lower extremities,  bilaterally.  -No evidence of popliteal cyst, bilaterally.   Antimicrobials:  Anti-infectives (From admission, onward)    Start     Dose/Rate Route Frequency Ordered Stop   01/01/23 1000  oseltamivir (TAMIFLU) capsule 75 mg        75 mg Oral Daily 12/31/22 1412 01/01/23 0938   12/31/22 1500  oseltamivir (TAMIFLU) capsule 30 mg        30 mg Oral  Once 12/31/22 1412 12/31/22 1749   12/29/22 1000  cefTRIAXone (ROCEPHIN) 2 g in sodium chloride 0.9 % 100 mL IVPB  Status:  Discontinued        2 g 200 mL/hr over 30 Minutes Intravenous Every 24 hours 12/28/22 0906 12/28/22 1143   12/29/22 1000  cefTRIAXone (ROCEPHIN) 2 g in sodium chloride 0.9 % 100 mL IVPB  Status:  Discontinued        2 g 200 mL/hr over 30 Minutes Intravenous Every 24 hours 12/28/22 1143 12/29/22 0845   12/29/22 1000  cefTRIAXone (ROCEPHIN) 2 g in sodium chloride 0.9 % 100 mL IVPB        2 g 200 mL/hr over 30 Minutes Intravenous Every 24 hours 12/29/22 0845 01/01/23 1007   12/28/22 2200  doxycycline (VIBRAMYCIN) 100 mg in sodium chloride 0.9 % 250 mL IVPB  Status:  Discontinued        100 mg 125 mL/hr over 120 Minutes Intravenous Every 12 hours 12/28/22 0906 12/29/22 1052   12/28/22 1300  oseltamivir (TAMIFLU) capsule 30 mg  Status:  Discontinued        30 mg Oral Daily 12/28/22 1200 12/31/22 1412   12/28/22 1000  cefTRIAXone (ROCEPHIN) 1 g in sodium chloride 0.9 % 100 mL IVPB        1 g 200  mL/hr over 30 Minutes Intravenous  Once 12/28/22 0906 12/28/22 1044   12/28/22 0345  cefTRIAXone (ROCEPHIN) 1 g in sodium chloride 0.9 % 100 mL IVPB        1 g 200 mL/hr over 30 Minutes Intravenous  Once 12/28/22 0337 12/28/22 0528   12/28/22 0345  doxycycline (VIBRA-TABS) tablet 100 mg        100 mg Oral  Once 12/28/22 0337 12/28/22 0459       Subjective: No new complaints  Objective: Vitals:   01/01/23 1613 01/01/23 2019 01/02/23 0525 01/02/23 0906  BP: (!) 134/95 (!) 150/89 (!) 153/97 (!) 159/103  Pulse: 97 73 72 87  Resp: '18 16 18 20  '$ Temp: (!) 97.5 F (36.4 C) 97.8 F (36.6 C) (!) 97.5 F (36.4 C) 98.1 F (36.7 C)  TempSrc: Oral Oral Oral Oral  SpO2: 93% 97% 95% 92%  Weight:      Height:        Intake/Output Summary (Last 24 hours) at 01/02/2023 1603 Last data filed at 01/01/2023 1700 Gross per 24 hour  Intake 480 ml  Output --  Net 480 ml   Filed Weights   12/28/22 0253 12/28/22 0852  Weight: 86.2 kg 94.5 kg    Examination:  General: No acute distress. Cardiovascular: RRR Lungs: unlabored Neurological: Alert and oriented 3. Moves all extremities 4 with equal strength. Cranial nerves II through XII grossly intact. Extremities: No clubbing or cyanosis. No edema.  Data Reviewed: I have personally reviewed following labs and imaging studies  CBC: Recent Labs  Lab 12/28/22 0320 12/29/22 0117 12/30/22 1227 12/31/22 0816 01/01/23 0253 01/02/23 0247  WBC 1.5* 1.6* 11.2* 16.4* 18.5* 16.1*  NEUTROABS 1.1*  --   --   --   --   --   HGB 11.5* 9.4* 9.5* 9.6* 8.8* 8.9*  HCT 35.1* 27.4* 29.1* 28.6* 26.6* 28.4*  MCV 90.2 87.5 89.5 88.8 89.3 91.9  PLT 115* 102* 138* 155 162 [id number]    Basic Metabolic Panel: Recent Labs  Lab 12/29/22 0117 12/30/22 1227 12/31/22 0816 01/01/23 0253 01/02/23 0247  NA 132* 134* 137 139 137  K 4.0 3.2* 3.2* 3.6 3.7  CL 99 100 102 105 103  CO2 21* '23 25 25 26  '$ GLUCOSE 108* 84 83 81 169*  BUN 51* 49* 39* 32* 23*  CREATININE  2.80* 1.32* 1.00 0.91 0.91  CALCIUM 7.7* 8.8* 8.9 8.8* 8.8*  MG 1.4* 2.6* 2.2 2.1 1.7    GFR: Estimated Creatinine Clearance: 82.4 mL/min (by C-G formula based on SCr of 0.91 mg/dL).  Liver Function Tests: Recent Labs  Lab 12/28/22 0320  AST 44*  ALT 19  ALKPHOS 49  BILITOT 0.6  PROT 7.5  ALBUMIN 3.5    CBG: Recent Labs  Lab 12/28/22 1216 12/28/22 1904 12/29/22 0301  GLUCAP 83 106* 109*     Recent Results (from the past 240 hour(s))  Resp panel by RT-PCR (RSV,  Bonnie Criselda Starke&B, Covid) Anterior Nasal Swab     Status: Abnormal   Collection Time: 12/25/22 11:11 AM   Specimen: Anterior Nasal Swab  Result Value Ref Range Status   SARS Coronavirus 2 by RT PCR NEGATIVE NEGATIVE Final   Influenza Maleah Rabago by PCR POSITIVE (Idil Maslanka) NEGATIVE Final   Influenza B by PCR NEGATIVE NEGATIVE Final    Comment: (NOTE) The Xpert Xpress SARS-CoV-2/Bonnie/RSV plus assay is intended as an aid in the diagnosis of influenza from Nasopharyngeal swab specimens and should not be used as Yarelie Hams sole basis for treatment. Nasal washings and aspirates are unacceptable for Xpert Xpress SARS-CoV-2/Bonnie/RSV testing.  Fact Sheet for Patients: EntrepreneurPulse.com.au  Fact Sheet for Healthcare Providers: IncredibleEmployment.be  This test is not yet approved or cleared by the [place] FDA and has been authorized for detection and/or diagnosis of SARS-CoV-2 by FDA under an Emergency Use Authorization (EUA). This EUA will remain in effect (meaning this test can be used) for the duration of the COVID-19 declaration under Section 564(b)(1) of the Act, 21 U.S.C. section 360bbb-3(b)(1), unless the authorization is terminated or revoked.     Resp Syncytial Virus by PCR NEGATIVE NEGATIVE Final    Comment: (NOTE) Fact Sheet for Patients: EntrepreneurPulse.com.au  Fact Sheet for Healthcare Providers: IncredibleEmployment.be  This test is not yet  approved or cleared by the [place] FDA and has been authorized for detection and/or diagnosis of SARS-CoV-2 by FDA under an Emergency Use Authorization (EUA). This EUA will remain in effect (meaning this test can be used) for the duration of the COVID-19 declaration under Section 564(b)(1) of the Act, 21 U.S.C. section 360bbb-3(b)(1), unless the authorization is terminated or revoked.  Performed at [facility], [place] [address], [place], [place] [address]   Blood culture (routine x 2)     Status: None   Collection Time: 12/28/22  4:15 AM   Specimen: BLOOD  Result Value Ref Range Status   Specimen Description   Final    BLOOD BLOOD RIGHT HAND Performed at [facility], [address] [place], [place] [address]    Special Requests   Final    BOTTLES DRAWN AEROBIC ONLY Blood Culture results may not be optimal due to an inadequate volume of blood received in culture bottles Performed at [facility], [address], [place], [place] [address]    Culture   Final    NO GROWTH 5 DAYS Performed at [facility], [place] [address] [place], [place] [address]    Report Status 01/02/2023 FINAL  Final  MRSA Next Gen by PCR, Nasal     Status: None   Collection Time: 12/28/22  1:31 PM   Specimen: Nasal Mucosa; Nasal Swab  Result Value Ref Range Status   MRSA by PCR Next Gen NOT DETECTED NOT DETECTED Final    Comment: (NOTE) The GeneXpert MRSA Assay (FDA approved for NASAL specimens only), is one component of Jynesis Nakamura comprehensive MRSA colonization surveillance program. It is not intended to diagnose MRSA infection nor to guide or monitor treatment for MRSA infections. Test performance is not FDA approved in patients less than 70 years old. Performed at [facility], [place] [address] [place], [place] [address]          Radiology Studies: No results found.      Scheduled Meds:  amLODipine  10 mg Oral Daily   guaiFENesin   1,200 mg Oral BID   heparin  5,000 Units Subcutaneous Q8H  influenza vac split quadrivalent PF  0.5 mL Intramuscular Tomorrow-1000   methadone  30 mg Oral Daily   methocarbamol  500 mg Oral BID   metoprolol succinate  25 mg Oral Daily   nicotine  21 mg Transdermal Daily   pneumococcal 20-valent conjugate vaccine  0.5 mL Intramuscular Tomorrow-1000   Continuous Infusions:   LOS: 5 days    Time spent: over 30 min    Fayrene Helper, MD [facility]   To contact the attending provider between 7A-7P or the covering provider during after hours 7P-7A, please log into the web site [email or web address] and access using universal [place] password for that web site. If you do not have the password, please call the hospital operator.  01/02/2023, 4:03 PM

## 2023-01-03 NOTE — Progress Notes (Signed)
Weaned down to room air at start of nightshift. O2 sat in low 90s overnight until this AM dropped to 83%. Required 2-3L to recover, now attempting to wean down again. No complaints of shortness or breath or breathing issues. Patient states feeling normal but becomes anxious over health concerns. BP 108/102 this AM.

## 2023-01-04 ENCOUNTER — Inpatient Hospital Stay (HOSPITAL_COMMUNITY): Payer: No Typology Code available for payment source

## 2023-01-04 DIAGNOSIS — J9601 Acute respiratory failure with hypoxia: Secondary | ICD-10-CM | POA: Diagnosis not present

## 2023-01-04 LAB — BASIC METABOLIC PANEL
Anion gap: 4 — ABNORMAL LOW (ref 5–15)
BUN: 11 mg/dL (ref 6–20)
CO2: 30 mmol/L (ref 22–32)
Calcium: 8.5 mg/dL — ABNORMAL LOW (ref 8.9–10.3)
Chloride: 103 mmol/L (ref 98–111)
Creatinine, Ser: 0.83 mg/dL (ref 0.44–1.00)
GFR, Estimated: >60 mL/min (ref >60)
Glucose, Bld: 81 mg/dL (ref 70–99)
Potassium: 3.5 mmol/L (ref 3.5–5.1)
Sodium: 137 mmol/L (ref 135–145)

## 2023-01-04 LAB — CBC
HCT: 27.5 % — ABNORMAL LOW (ref 36.0–46.0)
Hemoglobin: 9.1 g/dL — ABNORMAL LOW (ref 12.0–15.0)
MCH: 30.1 pg (ref 26.0–34.0)
MCHC: 33.1 g/dL (ref 30.0–36.0)
MCV: 91.1 fL (ref 80.0–100.0)
Platelets: 291 10*3/uL (ref 150–400)
RBC: 3.02 MIL/uL — ABNORMAL LOW (ref 3.87–5.11)
RDW: 15.9 % — ABNORMAL HIGH (ref 11.5–15.5)
WBC: 13.8 10*3/uL — ABNORMAL HIGH (ref 4.0–10.5)
nRBC: 0.1 % (ref 0.0–0.2)

## 2023-01-04 LAB — MAGNESIUM: Magnesium: 1.3 mg/dL — ABNORMAL LOW (ref 1.7–2.4)

## 2023-01-04 LAB — PHOSPHORUS: Phosphorus: 3.4 mg/dL (ref 2.5–4.6)

## 2023-01-04 MED ORDER — MAGNESIUM SULFATE 2 GM/50ML IV SOLN
2.0000 g | Freq: Once | INTRAVENOUS | Status: AC
  Administered 2023-01-04: 2 g via INTRAVENOUS
  Filled 2023-01-04: qty 50

## 2023-01-04 MED ORDER — IOHEXOL 350 MG/ML SOLN
75.0000 mL | Freq: Once | INTRAVENOUS | Status: AC | PRN
  Administered 2023-01-04: 75 mL via INTRAVENOUS

## 2023-01-04 NOTE — Progress Notes (Signed)
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  Hypoxic in the emergency department.  Admitted for acute hypoxic respiratory failure, influenza Bonnie Douglas, AKI, sepsis secondary to pneumonia. Started on IVF and tamiflu and doxycycline.  Developed ARDS type picture and was transferred to the critical care service for worsening respiratory distress.    Assessment & Plan:   Principal Problem:   Acute respiratory failure with hypoxia (HCC) Active Problems:   Sepsis due to pneumonia ([place])   Influenza Bonnie Douglas   AKI (acute kidney injury) (HCC)   Nausea, vomiting, and diarrhea   Hypokalemia   Hypocalcemia   History of substance abuse ([place])   Thrombocytopenia ([place])  Discharge delayed as unable to get home o2, will follow   Acute hypoxic respiratory failure Severe Sepsis (with AKI, elevated lactate) secondary to Flu Bonnie Douglas PNA  Possible bacterial pneumonia superimposed with markedly elevated procalcitonin Currently requiring 1 L intermittently, expect she'll need home o2 -> 90% RA at rest Continue empiric ceftriaxone Completed Osetalmivir   completed prednisone Chest PT/ Flutter/Guafenesin CT PE shows multifocal pneumonia, bronchitis, trace bilateral effusions, mediastinal and R hilar lymphadenopathy  Trace Compression Fractures - maybe related to fall prior to admission - follow outpatient, needs osteoporosis care   AKI Metabolic acidosis  No prior hx of CKD, likely pre-renal in the setting of acute infection On admission 3.88.  Baseline unknown. resolved   Normocytic anemia anemia Leukopenia Thrombocytopenia Resolved thormbocytopenia   PMH heroin, polysubstance use 35 pack year history  of smoking Continue chronic methadone. Nicotine patch "I will stop smoking" Would benefit from outpatient PFT's and lung cancer screening   Chronic pain Methadone Robaxin   Nausea/ vomiting/diarrhea, resolved Hypokalemia, resolved  Hypertension Restart amlodipine Looks like not taking metoprolol prior to admission? Will review     DVT prophylaxis: heparin  Code Status: full Family Communication: none Disposition:   Status is: Inpatient Remains inpatient appropriate because: continued O2 needs   Consultants:  PCCM  Procedures:  [place] Summary:  BILATERAL:  - No evidence of deep vein thrombosis seen in the lower extremities,  bilaterally.  - No evidence of superficial venous thrombosis in the lower extremities,  bilaterally.  -No evidence of popliteal cyst, bilaterally.   Antimicrobials:  Anti-infectives (From admission, onward)    Start     Dose/Rate Route Frequency Ordered Stop   01/01/23 1000  oseltamivir (TAMIFLU) capsule 75 mg        75 mg Oral Daily 12/31/22 1412 01/01/23 0938   12/31/22 1500  oseltamivir (TAMIFLU) capsule 30 mg        30 mg Oral  Once 12/31/22 1412 12/31/22 1749   12/29/22 1000  cefTRIAXone (ROCEPHIN) 2 g in sodium chloride 0.9 % 100 mL IVPB  Status:  Discontinued        2 g 200 mL/hr over 30 Minutes Intravenous Every 24 hours 12/28/22 0906 12/28/22 1143   12/29/22 1000  cefTRIAXone (ROCEPHIN) 2 g in sodium chloride 0.9 % 100 mL IVPB  Status:  Discontinued        2 g 200 mL/hr over 30 Minutes Intravenous Every 24 hours 12/28/22 1143 12/29/22 0845   12/29/22 1000  cefTRIAXone (ROCEPHIN) 2 g in sodium  chloride 0.9 % 100 mL IVPB        2 g 200 mL/hr over 30 Minutes Intravenous Every 24 hours 12/29/22 0845 01/01/23 1007   12/28/22 2200  doxycycline (VIBRAMYCIN) 100 mg in sodium chloride 0.9 % 250 mL IVPB  Status:  Discontinued        100 mg 125 mL/hr over 120 Minutes Intravenous Every 12 hours 12/28/22 0906 12/29/22 1052   12/28/22 1300   oseltamivir (TAMIFLU) capsule 30 mg  Status:  Discontinued        30 mg Oral Daily 12/28/22 1200 12/31/22 1412   12/28/22 1000  cefTRIAXone (ROCEPHIN) 1 g in sodium chloride 0.9 % 100 mL IVPB        1 g 200 mL/hr over 30 Minutes Intravenous  Once 12/28/22 0906 12/28/22 1044   12/28/22 0345  cefTRIAXone (ROCEPHIN) 1 g in sodium chloride 0.9 % 100 mL IVPB        1 g 200 mL/hr over 30 Minutes Intravenous  Once 12/28/22 0337 12/28/22 0528   12/28/22 0345  doxycycline (VIBRA-TABS) tablet 100 mg        100 mg Oral  Once 12/28/22 0337 12/28/22 0459       Subjective: No new complaints Back pain has returned  Objective: Vitals:   01/03/23 1059 01/03/23 1636 01/03/23 2122 01/04/23 0430  BP:  138/87 (!) 144/96 (!) 154/98  Pulse:  87 85 97  Resp:  '20 16 18  '$ Temp:  98 F (36.7 C) 98 F (36.7 C) 98.4 F (36.9 C)  TempSrc:  Oral Oral Oral  SpO2: 91% 92% 91% 91%  Weight:      Height:        Intake/Output Summary (Last 24 hours) at 01/04/2023 1702 Last data filed at 01/04/2023 0442 Gross per 24 hour  Intake --  Output 950 ml  Net -950 ml   Filed Weights   12/28/22 0253 12/28/22 0852  Weight: 86.2 kg 94.5 kg    Examination:  General: No acute distress. Cardiovascular: sinus tach Lungs: unlabored, coarse cough  Abdomen: Soft, nontender, nondistended  Neurological: Alert and oriented 3. Moves all extremities 4 with equal strength. Cranial nerves II through XII grossly intact. Extremities: No clubbing or cyanosis. No edema.  Data Reviewed: I have personally reviewed following labs and imaging studies  CBC: Recent Labs  Lab 12/30/22 1227 12/31/22 0816 01/01/23 0253 01/02/23 0247 01/04/23 0308  WBC 11.2* 16.4* 18.5* 16.1* 13.8*  HGB 9.5* 9.6* 8.8* 8.9* 9.1*  HCT 29.1* 28.6* 26.6* 28.4* 27.5*  MCV 89.5 88.8 89.3 91.9 91.1  PLT 138* 155 162 185 [id number]    Basic Metabolic Panel: Recent Labs  Lab 12/30/22 1227 12/31/22 0816 01/01/23 0253 01/02/23 0247 01/04/23 0308   NA 134* 137 139 137 137  K 3.2* 3.2* 3.6 3.7 3.5  CL 100 102 105 103 103  CO2 '23 25 25 26 30  '$ GLUCOSE 84 83 81 169* 81  BUN 49* 39* 32* 23* 11  CREATININE 1.32* 1.00 0.91 0.91 0.83  CALCIUM 8.8* 8.9 8.8* 8.8* 8.5*  MG 2.6* 2.2 2.1 1.7 1.3*  PHOS  --   --   --   --  3.4    GFR: Estimated Creatinine Clearance: 90.4 mL/min (by C-G formula based on SCr of 0.83 mg/dL).  Liver Function Tests: No results for input(s): "AST", "ALT", "ALKPHOS", "BILITOT", "PROT", "ALBUMIN" in the last 168 hours.   CBG: Recent Labs  Lab 12/28/22 1904 12/29/22 0301  GLUCAP 106* 109*  Recent Results (from the past 240 hour(s))  Blood culture (routine x 2)     Status: None   Collection Time: 12/28/22  4:15 AM   Specimen: BLOOD  Result Value Ref Range Status   Specimen Description   Final    BLOOD BLOOD RIGHT HAND Performed at [facility], [address], [place], [place] [address]    Special Requests   Final    BOTTLES DRAWN AEROBIC ONLY Blood Culture results may not be optimal due to an inadequate volume of blood received in culture bottles Performed at [facility], [address], [place], [place] [address]    Culture   Final    NO GROWTH 5 DAYS Performed at [facility], [place] [address], [place], [place] [address]    Report Status 01/02/2023 FINAL  Final  MRSA Next Gen by PCR, Nasal     Status: None   Collection Time: 12/28/22  1:31 PM   Specimen: Nasal Mucosa; Nasal Swab  Result Value Ref Range Status   MRSA by PCR Next Gen NOT DETECTED NOT DETECTED Final    Comment: (NOTE) The GeneXpert MRSA Assay (FDA approved for NASAL specimens only), is one component of Quintessa Simmerman comprehensive MRSA colonization surveillance program. It is not intended to diagnose MRSA infection nor to guide or monitor treatment for MRSA infections. Test performance is not FDA approved in patients less than 31 years old. Performed at [facility], [place] [address], [place], [place] [address]          Radiology Studies: CT Angio Chest Pulmonary Embolism (PE) W or WO Contrast  Result Date: 01/04/2023 CLINICAL DATA:  Shortness of breath EXAM: CT ANGIOGRAPHY CHEST WITH CONTRAST TECHNIQUE: Multidetector CT imaging of the chest was performed using the standard protocol during bolus administration of intravenous contrast. Multiplanar CT image reconstructions and MIPs were obtained to evaluate the vascular anatomy. RADIATION DOSE REDUCTION: This exam was performed according to the departmental dose-optimization program which includes automated exposure control, adjustment of the mA and/or kV according to patient size and/or use of iterative reconstruction technique. CONTRAST:  72m OMNIPAQUE IOHEXOL 350 MG/ML SOLN COMPARISON:  CT angiogram chest 04/07/2021 FINDINGS: Cardiovascular: Satisfactory opacification of the pulmonary arteries to the segmental level. No evidence of pulmonary embolism. Normal heart size. No pericardial effusion. Mediastinum/Nodes: There is an enlarged subcarinal lymph node measuring 14 mm. There are prominent prevascular, AP window, precarinal and paratracheal lymph nodes as well. Mildly enlarged right hilar lymph nodes measure up to 1 cm. Visualized esophagus and thyroid gland are within normal limits. Lungs/Pleura: There are trace bilateral pleural effusions. Mild emphysematous changes are present. There is scarring in the right lung apex. Multifocal airspace and ground-glass opacities are seen within the bilateral lower lobes and minimally within the lingula and right middle lobe. There is dense airspace consolidation in both lung bases with bilateral lower lobe peribronchial thickening and plugging of bronchi. Trachea patent. No pneumothorax. Upper Abdomen: No acute abnormality. Musculoskeletal: No chest wall abnormality. There are trace compression deformities of the superior endplates of T5 and T6, new from prior. Review of the MIP images  confirms the above findings. IMPRESSION: 1. No evidence for pulmonary embolism. 2. Multifocal airspace and ground-glass opacities in the bilateral lower lobes and minimally within the right middle lobe and lingula worrisome for multifocal pneumonia. 3. Bilateral lower lobe peribronchial thickening and plugging of bronchi worrisome for bronchitis. 4. Trace bilateral pleural effusions. 5. Mediastinal and right hilar lymphadenopathy, likely reactive.  6. New trace compression fractures superior endplates of T5 and T6. Correlate clinically for acuity. Electronically Signed   By: Ronney Asters M.D.   On: 01/04/2023 15:49        Scheduled Meds:  amLODipine  10 mg Oral Daily   guaiFENesin  1,200 mg Oral BID   heparin  5,000 Units Subcutaneous Q8H   methadone  30 mg Oral Daily   methocarbamol  500 mg Oral BID   metoprolol succinate  25 mg Oral Daily   nicotine  21 mg Transdermal Daily   Continuous Infusions:   LOS: 7 days    Time spent: over 30 min    Fayrene Helper, MD [facility]   To contact the attending provider between 7A-7P or the covering provider during after hours 7P-7A, please log into the web site [email or web address] and access using universal [place] password for that web site. If you do not have the password, please call the hospital operator.  01/04/2023, 5:02 PM

## 2023-01-04 NOTE — Plan of Care (Signed)

## 2023-01-04 NOTE — TOC Progression Note (Signed)
Transition of Care [facility]) - Progression Note    Patient Details  Name: MARGART JACKSON MRN: [id number] Date of Birth: 1966-03-01  Transition [facility] CM/SW Contact  Graves-Bigelow, Ocie Cornfield, RN Phone Number: 01/04/2023, 3:35 PM  Clinical Narrative:  Case Manager received notification that the patient will need oxygen for home. Patient has Medicaid out of state. [employer] and [employer] unable to service the patient due to out of state medicaid. [employer] states patient can get the oxygen if she pays $200.00 out of pocket per month. Patient reports she will be working on seeing if she can be approved for [facility] Medicaid. Case Manager has called [employer] and [employer] to see if they can assist with DME needs-awaiting call back.   Expected Discharge Plan: Home/Self Care Barriers to Discharge: Continued Medical Work up  Expected Discharge Plan and Services   Discharge Planning Services: CM Consult Post Acute Care Choice: Resumption of Svcs/PTA Provider Living arrangements for the past 2 months: Apartment (Patient currently living with her daughter at [address] [place], [place] [address])  Social Determinants of Health ([place]) Interventions [place]: Unknown (12/29/2022)  Housing: [place]  (12/29/2022)  Transportation Needs: No Transportation Needs (12/30/2022)  Utilities: Not At Risk (12/30/2022)  Tobacco Use: High Risk (12/30/2022)    Readmission Risk Interventions    12/29/2022    4:19 PM  Readmission Risk Prevention Plan  Transportation Screening Complete  PCP or Specialist Appt within 5-7 Days Complete  Home Care Screening Complete  Medication Review (RN CM) Complete

## 2023-01-05 DIAGNOSIS — N179 Acute kidney failure, unspecified: Secondary | ICD-10-CM | POA: Diagnosis not present

## 2023-01-05 DIAGNOSIS — J189 Pneumonia, unspecified organism: Secondary | ICD-10-CM | POA: Diagnosis not present

## 2023-01-05 DIAGNOSIS — J101 Influenza due to other identified influenza virus with other respiratory manifestations: Secondary | ICD-10-CM | POA: Diagnosis not present

## 2023-01-05 DIAGNOSIS — J9601 Acute respiratory failure with hypoxia: Secondary | ICD-10-CM | POA: Diagnosis not present

## 2023-01-05 LAB — CBC
HCT: 25 % — ABNORMAL LOW (ref 36.0–46.0)
Hemoglobin: 8.4 g/dL — ABNORMAL LOW (ref 12.0–15.0)
MCH: 31 pg (ref 26.0–34.0)
MCHC: 33.6 g/dL (ref 30.0–36.0)
MCV: 92.3 fL (ref 80.0–100.0)
Platelets: 285 10*3/uL (ref 150–400)
RBC: 2.71 MIL/uL — ABNORMAL LOW (ref 3.87–5.11)
RDW: 16.4 % — ABNORMAL HIGH (ref 11.5–15.5)
WBC: 10.3 10*3/uL (ref 4.0–10.5)
nRBC: 0 % (ref 0.0–0.2)

## 2023-01-05 LAB — PHOSPHORUS: Phosphorus: 3.6 mg/dL (ref 2.5–4.6)

## 2023-01-05 LAB — BASIC METABOLIC PANEL
Anion gap: 8 (ref 5–15)
BUN: 9 mg/dL (ref 6–20)
CO2: 27 mmol/L (ref 22–32)
Calcium: 8.4 mg/dL — ABNORMAL LOW (ref 8.9–10.3)
Chloride: 101 mmol/L (ref 98–111)
Creatinine, Ser: 0.75 mg/dL (ref 0.44–1.00)
GFR, Estimated: >60 mL/min (ref >60)
Glucose, Bld: 88 mg/dL (ref 70–99)
Potassium: 3.7 mmol/L (ref 3.5–5.1)
Sodium: 136 mmol/L (ref 135–145)

## 2023-01-05 LAB — MAGNESIUM: Magnesium: 1.8 mg/dL (ref 1.7–2.4)

## 2023-01-05 MED ORDER — METOPROLOL SUCCINATE ER 25 MG PO TB24: 25.0000 mg | ORAL_TABLET | Freq: Every day | ORAL | 0 refills | Status: AC

## 2023-01-05 MED ORDER — ALBUTEROL SULFATE HFA 108 (90 BASE) MCG/ACT IN AERS: 2.0000 | INHALATION_SPRAY | Freq: Four times a day (QID) | RESPIRATORY_TRACT | 2 refills | Status: DC | PRN

## 2023-01-05 MED ORDER — BENZONATATE 100 MG PO CAPS: 100.0000 mg | ORAL_CAPSULE | Freq: Three times a day (TID) | ORAL | 0 refills | Status: AC

## 2023-01-05 MED ORDER — NICOTINE 21 MG/24HR TD PT24: 21.0000 mg | MEDICATED_PATCH | Freq: Every day | TRANSDERMAL | 0 refills | Status: DC

## 2023-01-05 NOTE — TOC Transition Note (Signed)
Transition of Care [facility]) - CM/SW Discharge Note   Patient Details  Name: Bonnie Douglas MRN: [id number] Date of Birth: 14-Feb-1966  Transition of [facility] CM/SW Contact:  Bethena Roys, RN Phone Number: 01/05/2023, 1:29 PM   Clinical Narrative:  Case Manager received a message from [facility] and they cannot accept Medicaid out of State. Case Manager did call [facility] and they can accept the patient for oxygen services. Patient will transition home today. DME oxygen will be delivered to the room prior to discharge. No further needs identified at this time.   Final next level of care: Home/Self Care Barriers to Discharge: No Barriers Identified  Patient Goals and CMS Choice CMS Medicare.gov Compare Post Acute Care list provided to:: Patient Represenative (must comment) (patient's daughter Bonnie Douglas [id number]) Choice offered to / list presented to : Adult Children  Discharge Plan and Services Additional resources added to the After Visit Summary for     Discharge Planning Services: CM Consult Post Acute Care Choice: Resumption of Svcs/PTA Provider           Social Determinants of Health (SDOH) Interventions SDOH Screenings   Food Insecurity: Unknown (12/29/2022)  Housing: Low Risk  (12/29/2022)  Transportation Needs: No Transportation Needs (12/30/2022)  Utilities: Not At Risk (12/30/2022)  Tobacco Use: High Risk (12/30/2022)  Readmission Risk Interventions    12/29/2022    4:19 PM  Readmission Risk Prevention Plan  Transportation Screening Complete  PCP or Specialist Appt within 5-7 Days Complete  Home Care Screening Complete  Medication Review (RN CM) Complete

## 2023-01-05 NOTE — Progress Notes (Signed)
Nurse requested Mobility Specialist to perform oxygen saturation test with pt which includes removing pt from oxygen both at rest and while ambulating.  Below are the results from that testing.     Patient Saturations on Room Air at Rest = spO2 88%  Patient Saturations on Room Air while Ambulating = sp02 86% .  Patient Saturations on 4 Liters of oxygen while Ambulating = sp02 91%  At end of testing pt left in room on 2  Liters of oxygen.  Reported results to nurse.   Nelta Numbers Mobility Specialist Please contact via SecureChat or  Rehab office at [phone]

## 2023-01-05 NOTE — Progress Notes (Signed)
Mobility Specialist Progress Note:   01/05/23 1200  Mobility  Activity Ambulated with assistance in hallway  Level of Assistance Standby assist, set-up cues, supervision of patient - no hands on  Assistive Device None  Distance Ambulated (ft) 250 ft  Activity Response Tolerated fair  Mobility Referral Yes  $Mobility charge 1 Mobility   Pt agreeable to mobility session. Required no physical assistance, only supervision for safety. 4LO2 required to maintain SpO2 >89%. Pt left sitting EOB with all needs met.   Nelta Numbers Mobility Specialist Please contact via SecureChat or  Rehab office at [phone]

## 2023-01-05 NOTE — Progress Notes (Signed)
Physical Therapy Treatment Patient Details Name: Bonnie Douglas MRN: [id number] DOB: [birth year] Today's Date: 01/05/2023   History of Present Illness 57 y.o. female presents to [facility] hospital on 12/28/2022 with nausea, vomiting, diarrhea, myalgias and cough. Pt admitted for acute hypoxic respiratory failure, flu A, AKI, sepsis 2/2 PNA. PMH includes HTN, depression, fibromyalgia.    PT Comments    Patient progressing well towards PT goals. Session focused on energy conservation, endurance and overall mobility. Pt mobilizing at Mod I-independent level. Continues to have drop in oxygen with mobility esp with longer distances. Sp02 remained >92% on RA at rest, dropped to 86% on RA with activity and drops occasionally to 88-89% on RA but able to recover to >90% with rest break and cues for breathing.  Encouraged keeping off 02 at rest in room and only donning 02 if not able to improve saturations with pursed lip breathing. Pt asymptomatic with drop in 02. Education on energy conservation techniques and importance of short bouts of activity at this time with longer rest breaks. Attempting to wean supplemental 02. RN aware. Does not require skilled therapy services but recommend working with mobility tech and walking a few times daily as well as using IS and flutter valve. Discharge from therapy.  Recommendations for follow up therapy are one component of a multi-disciplinary discharge planning process, led by the attending physician.  Recommendations may be updated based on patient status, additional functional criteria and insurance authorization.  Follow Up Recommendations  No PT follow up     Assistance Recommended at Discharge PRN  Patient can return home with the following Assistance with cooking/housework   Equipment Recommendations  None recommended by PT    Recommendations for Other Services       Precautions / Restrictions Precautions Precautions: Other (comment) Precaution Comments:  monitor SpO2 Restrictions Weight Bearing Restrictions: No     Mobility  Bed Mobility Overal bed mobility: Modified Independent                  Transfers Overall transfer level: Independent Equipment used: None Transfers: Sit to/from Stand Sit to Stand: Modified independent (Device/Increase time)           General transfer comment: No assist needed, independent    Ambulation/Gait Ambulation/Gait assistance: Modified independent (Device/Increase time) Gait Distance (Feet): 200 Feet Assistive device: None Gait Pattern/deviations: Step-through pattern   Gait velocity interpretation: 1.31 - 2.62 ft/sec, indicative of limited community ambulator   General Gait Details: Slow, steady gait, no evidence of imbalance   Stairs             Wheelchair Mobility    Modified Rankin (Stroke Patients Only)       Balance Overall balance assessment: Modified Independent                                          Cognition Arousal/Alertness: Awake/alert Behavior During Therapy: WFL for tasks assessed/performed Overall Cognitive Status: Within Functional Limits for tasks assessed                                          Exercises      General Comments General comments (skin integrity, edema, etc.): Sp02 remained >92% on RA at rest, dropped to 86% on RA with activity, drops occasionally  to 88-89% on RA but able to recover to >90% with rest break and cues for breathing.      Pertinent Vitals/Pain Pain Assessment Pain Assessment: No/denies pain    Home Living                          Prior Function            PT Goals (current goals can now be found in the care plan section) Progress towards PT goals: Goals met/education completed, patient discharged from PT    Frequency    Min 3X/week      PT Plan Current plan remains appropriate    Co-evaluation              AM-PAC PT "6 Clicks" Mobility    Outcome Measure  Help needed turning from your back to your side while in a flat bed without using bedrails?: None Help needed moving from lying on your back to sitting on the side of a flat bed without using bedrails?: None Help needed moving to and from a bed to a chair (including a wheelchair)?: None Help needed standing up from a chair using your arms (e.g., wheelchair or bedside chair)?: None Help needed to walk in hospital room?: None Help needed climbing 3-5 steps with a railing? : A Little 6 Click Score: 23    End of Session Equipment Utilized During Treatment: Oxygen Activity Tolerance: Patient tolerated treatment well;Treatment limited secondary to medical complications (Comment) (drop in Sp02) Patient left: in bed;with call bell/phone within reach Nurse Communication: Mobility status PT Visit Diagnosis: Other abnormalities of gait and mobility (R26.89)     Time: [id number] PT Time Calculation (min) (ACUTE ONLY): 16 min  Charges:  $Therapeutic Exercise: 8-22 mins                     Marisa Severin, PT, DPT Acute Rehabilitation Services Secure chat preferred Office [phone]      [name] 01/05/2023, 12:24 PM

## 2023-01-05 NOTE — Plan of Care (Signed)

## 2023-01-05 NOTE — Discharge Summary (Incomplete)
Physician Discharge Summary  Bonnie Douglas [id number] DOB: March 29, 1966 DOA: 12/28/2022  PCP: System, Provider Not In  Admit date: 12/28/2022 Discharge date: 01/06/2023  Admitted From: Home  Discharge disposition: Home  Recommendations for Outpatient Follow-Up:   Follow up with your primary care provider in one week.  Patient has been prescribed oxygen on discharge.  Titrate as able as outpatient. Check CBC, BMP, magnesium in the next visit Patient will benefit from outpatient PFT and lung cancer screening.  Discharge Diagnosis:   Principal Problem:   Acute respiratory failure with hypoxia (HCC) Active Problems:   Sepsis due to pneumonia ([place])   Influenza A   AKI (acute kidney injury) ([place])   Nausea, vomiting, and diarrhea   Hypokalemia   Hypocalcemia   History of substance abuse ([place])   Thrombocytopenia ([place])  Discharge Condition: Improved.  Diet recommendation: Low sodium, heart healthy.    Wound care: None.  Code status: Full.   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  Hypoxic in the emergency department.  Admitted for acute hypoxic respiratory failure, influenza A, AKI, sepsis secondary to pneumonia. Started on IVF and tamiflu and doxycycline.  Developed ARDS type picture and was transferred to the critical care service for worsening respiratory distress.   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  Has qualified for home oxygen on discharge.  Would recommend continuation of antibiotic on discharge.  Severe Sepsis (with AKI, elevated lactate) secondary to  Flu A PNA  Possible bacterial pneumonia superimposed with markedly elevated procalcitonin Currently requiring 2 L of oxygen and will be prescribed on discharge.  Patient has received Rocephin Tamiflu prednisone during hospitalization.  CT scan showed multifocal pneumonia, bronchitis, trace bilateral effusions, mediastinal and R hilar lymphadenopathy.  At this time, patient will follow-up with PCP as outpatient.   Trace Compression Fractures Continue supportive care.   AKI/Metabolic acidosis  Resolved.  Creatinine on discharge was 0.7.  Normocytic anemia /Leukopenia/Thrombocytopenia Number cytopenia resolved.  Mild anemia present.  Will need to follow-up as outpatient.   PMH heroin, polysubstance use/35 pack year history of smoking Continue chronic methadone.  Continue nicotine patch on discharge.  Will benefit from outpatient PFT and lung cancer screening.   Chronic pain Continue methadone/Robaxin   Nausea/ vomiting/diarrhea, resolved Hypokalemia, resolved   Essential hypertension Continue metoprolol, amlodipine.  Disposition.  At this time, patient is stable for disposition with outpatient PCP follow-up.  Medical Consultants:   PCCM  Procedures:    None Subjective:   Today, patient was seen and examined at bedside.  Continues to feel better.  Wants to go home.  Discharge Exam:   Vitals:   01/05/23 0817 01/05/23 1415  BP: (!) 145/91 132/87  Pulse: 96 93  Resp: 18 18  Temp: 98.2 F (36.8 C) 98 F (36.7 C)  SpO2: 93% 92%   Vitals:   01/04/23 2354 01/05/23 0600 01/05/23 0817 01/05/23 1415  BP: (!) 138/98 132/80 (!) 145/91 132/87  Pulse: 91 98 96 93  Resp: '18 18 18 18  '$ Temp: 98.2 F (36.8 C) 98 F (36.7 C) 98.2 F (36.8 C) 98 F (36.7 C)  TempSrc: Oral Oral Oral Oral  SpO2: 93% 94% 93% 92%  Weight:      Height:       General: Alert awake, not in obvious distress, on nasal cannula oxygen HENT: pupils equally reacting to light,  No scleral pallor or  icterus noted. Oral mucosa is moist.  Chest:  .  Diminished breath sounds bilaterally. No crackles or wheezes.  CVS: S1 &S2 heard. No murmur.  Regular rate and rhythm. Abdomen: Soft, nontender, nondistended.  Bowel sounds are heard.   Extremities: No cyanosis, clubbing or edema.  Peripheral pulses are palpable. Psych: Alert, awake and oriented, normal mood CNS:  No cranial nerve deficits.  Power equal in all extremities.   Skin: Warm and dry.  No rashes noted.  The results of significant diagnostics from this hospitalization (including imaging, microbiology, ancillary and laboratory) are listed below for reference.     Diagnostic Studies:   VAS [place] LOWER EXTREMITY VENOUS (DVT)  Result Date: 12/29/2022  Lower Venous DVT Study Patient Name:  Bonnie Douglas [facility]  Date of Exam:   12/29/2022 Medical Rec #: [id number]      Accession #:    [id number] Date of Birth: 08-Sep-1966      Patient Gender: F Patient Age:   58 years Exam Location:  [facility] Procedure:      VAS [place] LOWER EXTREMITY VENOUS (DVT) Referring Phys: Ina Homes --------------------------------------------------------------------------------  Indications: Edema, and SOB.  Comparison Study: No prior study. Performing Technologist: McKayla Maag RVT, [place]  Examination Guidelines: A complete evaluation includes B-mode imaging, spectral Doppler, color Doppler, and power Doppler as needed of all accessible portions of each vessel. Bilateral testing is considered an integral part of a complete examination. Limited examinations for reoccurring indications may be performed as noted. The reflux portion of the exam is performed with the patient in reverse Trendelenburg.  +---------+---------------+---------+-----------+----------+--------------+ RIGHT    CompressibilityPhasicitySpontaneityPropertiesThrombus Aging +---------+---------------+---------+-----------+----------+--------------+ CFV      Full           Yes      Yes                                  +---------+---------------+---------+-----------+----------+--------------+ SFJ      Full                                                        +---------+---------------+---------+-----------+----------+--------------+ FV Prox  Full                                                        +---------+---------------+---------+-----------+----------+--------------+ FV Mid   Full                                                        +---------+---------------+---------+-----------+----------+--------------+ FV DistalFull                                                        +---------+---------------+---------+-----------+----------+--------------+  PFV      Full                                                        +---------+---------------+---------+-----------+----------+--------------+ POP      Full           Yes      Yes                                 +---------+---------------+---------+-----------+----------+--------------+ PTV      Full                                                        +---------+---------------+---------+-----------+----------+--------------+ PERO     Full                                                        +---------+---------------+---------+-----------+----------+--------------+   +---------+---------------+---------+-----------+----------+--------------+ LEFT     CompressibilityPhasicitySpontaneityPropertiesThrombus Aging +---------+---------------+---------+-----------+----------+--------------+ CFV      Full           Yes      Yes                                 +---------+---------------+---------+-----------+----------+--------------+ SFJ      Full                                                        +---------+---------------+---------+-----------+----------+--------------+ FV Prox  Full                                                         +---------+---------------+---------+-----------+----------+--------------+ FV Mid   Full                                                        +---------+---------------+---------+-----------+----------+--------------+ FV DistalFull                                                        +---------+---------------+---------+-----------+----------+--------------+ PFV      Full                                                        +---------+---------------+---------+-----------+----------+--------------+  POP      Full           Yes      Yes                                 +---------+---------------+---------+-----------+----------+--------------+ PTV      Full                                                        +---------+---------------+---------+-----------+----------+--------------+ PERO     Full                                                        +---------+---------------+---------+-----------+----------+--------------+     Summary: BILATERAL: - No evidence of deep vein thrombosis seen in the lower extremities, bilaterally. - No evidence of superficial venous thrombosis in the lower extremities, bilaterally. -No evidence of popliteal cyst, bilaterally.   *See table(s) above for measurements and observations. Electronically signed by Servando Snare MD on 12/29/2022 at 7:24:40 PM.    Final    DG Chest Port 1 View  Result Date: 12/29/2022 CLINICAL DATA:  ARDS EXAM: PORTABLE CHEST 1 VIEW COMPARISON:  CXR 12/28/22 FINDINGS: No pleural effusion. No pneumothorax. Unchanged cardiac and mediastinal contours. Slight interval increase in bibasilar airspace opacities. Possible trace left-sided pleural effusion. No radiographically apparent displaced rib fractures. Visualized upper abdomen is unremarkable. IMPRESSION: 1. Increased bibasilar airspace opacities, right greater than left. 2. Possible trace left-sided pleural effusion. Electronically Signed   By: Marin Roberts M.D.    On: 12/29/2022 07:49   US RENAL  Result Date: 12/28/2022 CLINICAL DATA:  Acute kidney injury EXAM: RENAL / URINARY TRACT ULTRASOUND COMPLETE COMPARISON:  CT noncontrast 03/29/2021 FINDINGS: Right Kidney: Renal measurements: 8.7 x 4.9 x 6.0 = volume: 131.4 mL. Echogenicity within normal limits. No mass or hydronephrosis visualized. Left Kidney: Renal measurements: 11.4 x 5.5 x 5.7 = volume: 188.1 mL. Echogenicity within normal limits. No mass or hydronephrosis visualized. Bladder: Distended bladder.  Preserved contour. Other: None. IMPRESSION: No collecting system dilatation Electronically Signed   By: Jill Side M.D.   On: 12/28/2022 13:16   DG Chest Port 1 View  Result Date: 12/28/2022 CLINICAL DATA:  Influenza.  Nausea, vomiting, and diarrhea. EXAM: PORTABLE CHEST 1 VIEW COMPARISON:  12/25/2022. FINDINGS: The heart size and mediastinal contours are within normal limits. Patchy airspace disease is noted in the mid to lower lung fields bilaterally, greater on the right than on the left. No effusion or pneumothorax. No acute osseous abnormality. IMPRESSION: Patchy airspace disease in the mid to lower lung fields bilaterally, concerning for pneumonia. Electronically Signed   By: Brett Fairy M.D.   On: 12/28/2022 03:31     Labs:   Basic Metabolic Panel: Recent Labs  Lab 12/31/22 0816 01/01/23 0253 01/02/23 0247 01/04/23 0308 01/05/23 0546  NA 137 139 137 137 136  K 3.2* 3.6 3.7 3.5 3.7  CL 102 105 103 103 101  CO2 '25 25 26 30 27  '$ GLUCOSE 83 81 169* 81 88  BUN 39* 32* 23* 11 9  CREATININE 1.00 0.91 0.91 0.83  0.75  CALCIUM 8.9 8.8* 8.8* 8.5* 8.4*  MG 2.2 2.1 1.7 1.3* 1.8  PHOS  --   --   --  3.4 3.6   GFR Estimated Creatinine Clearance: 93.8 mL/min (by C-G formula based on SCr of 0.75 mg/dL). Liver Function Tests: No results for input(s): "AST", "ALT", "ALKPHOS", "BILITOT", "PROT", "ALBUMIN" in the last 168 hours. No results for input(s): "LIPASE", "AMYLASE" in the last 168  hours. No results for input(s): "AMMONIA" in the last 168 hours. Coagulation profile No results for input(s): "INR", "PROTIME" in the last 168 hours.  CBC: Recent Labs  Lab 12/31/22 0816 01/01/23 0253 01/02/23 0247 01/04/23 0308 01/05/23 0546  WBC 16.4* 18.5* 16.1* 13.8* 10.3  HGB 9.6* 8.8* 8.9* 9.1* 8.4*  HCT 28.6* 26.6* 28.4* 27.5* 25.0*  MCV 88.8 89.3 91.9 91.1 92.3  PLT 155 162 185 291 285   Cardiac Enzymes: No results for input(s): "CKTOTAL", "CKMB", "CKMBINDEX", "TROPONINI" in the last 168 hours. BNP: Invalid input(s): "POCBNP" CBG: No results for input(s): "GLUCAP" in the last 168 hours. D-Dimer No results for input(s): "DDIMER" in the last 72 hours. Hgb A1c No results for input(s): "HGBA1C" in the last 72 hours. Lipid Profile No results for input(s): "CHOL", "HDL", "LDLCALC", "TRIG", "CHOLHDL", "LDLDIRECT" in the last 72 hours. Thyroid function studies No results for input(s): "TSH", "T4TOTAL", "T3FREE", "THYROIDAB" in the last 72 hours.  Invalid input(s): "FREET3" Anemia work up No results for input(s): "VITAMINB12", "FOLATE", "FERRITIN", "TIBC", "IRON", "RETICCTPCT" in the last 72 hours. Microbiology Recent Results (from the past 240 hour(s))  Blood culture (routine x 2)     Status: None   Collection Time: 12/28/22  4:15 AM   Specimen: BLOOD  Result Value Ref Range Status   Specimen Description   Final    BLOOD BLOOD RIGHT HAND Performed at [facility], [address] [place], [place] [address]    Special Requests   Final    BOTTLES DRAWN AEROBIC ONLY Blood Culture results may not be optimal due to an inadequate volume of blood received in culture bottles Performed at [facility], [address] [place], [place] [address]    Culture   Final    NO GROWTH 5 DAYS Performed at [facility], [place] [address], [place], [place] [address]    Report Status 01/02/2023 FINAL  Final  MRSA Next Gen by PCR, Nasal      Status: None   Collection Time: 12/28/22  1:31 PM   Specimen: Nasal Mucosa; Nasal Swab  Result Value Ref Range Status   MRSA by PCR Next Gen NOT DETECTED NOT DETECTED Final    Comment: (NOTE) The GeneXpert MRSA Assay (FDA approved for NASAL specimens only), is one component of a comprehensive MRSA colonization surveillance program. It is not intended to diagnose MRSA infection nor to guide or monitor treatment for MRSA infections. Test performance is not FDA approved in patients less than 71 years old. Performed at [facility], [place] [address], [place], [place] [address]      Discharge Instructions:   Discharge Instructions     Call MD for:  temperature >100.4   Complete by: As directed    Diet general   Complete by: As directed    Discharge instructions   Complete by: As directed    Follow-up with your primary care provider in 1 week.  No smoking.  Continue oxygen at home.  Continue to use inhaler at home as necessary.  No overexertion.  Increase activity slowly   Complete by: As directed       Allergies as of 01/05/2023       Reactions   Biaxin [clarithromycin] Nausea And Vomiting   Vicodin [hydrocodone-acetaminophen] Other (See Comments)   heacache   Latex Rash        Medication List     STOP taking these medications    azithromycin 250 MG tablet Commonly known as: ZITHROMAX       TAKE these medications    albuterol 108 (90 Base) MCG/ACT inhaler Commonly known as: VENTOLIN HFA Inhale 2 puffs into the lungs every 6 (six) hours as needed for wheezing or shortness of breath.   amLODipine 5 MG tablet Commonly known as: NORVASC Take 2 tablets (10 mg total) by mouth daily.   benzonatate 100 MG capsule Commonly known as: TESSALON Take 1 capsule (100 mg total) by mouth every 8 (eight) hours.   gabapentin 300 MG capsule Commonly known as: NEURONTIN Take 600 mg by mouth 3 (three) times daily.   ibuprofen 600 MG tablet Commonly known as:  ADVIL Take 1 tablet (600 mg total) by mouth every 6 (six) hours as needed. What changed: reasons to take this   methocarbamol 500 MG tablet Commonly known as: ROBAXIN Take 1 tablet (500 mg total) by mouth 2 (two) times daily. What changed:  when to take this reasons to take this   metoprolol succinate 25 MG 24 hr tablet Commonly known as: TOPROL-XL Take 1 tablet (25 mg total) by mouth daily.   nicotine 21 mg/24hr patch Commonly known as: NICODERM CQ - dosed in mg/24 hours Place 1 patch (21 mg total) onto the skin daily. Start taking on: January 06, 2023   ondansetron 4 MG disintegrating tablet Commonly known as: ZOFRAN-ODT Take 1 tablet (4 mg total) by mouth every 8 (eight) hours as needed for nausea or vomiting.               Durable Medical Equipment  (From admission, onward)           Start     Ordered   01/03/23 1659  For home use only DME oxygen  Once       Question Answer Comment  Length of Need 12 Months   Mode or (Route) Nasal cannula   Liters per Minute 2   Frequency Continuous (stationary and portable oxygen unit needed)   Oxygen conserving device Yes   Oxygen delivery system Gas      01/03/23 1658            Follow-up Information     Gildardo Pounds, NP. Go on 01/14/2023.   Specialty: Nurse Practitioner Why: You are scheduled for a hospital follow up on January 14, 2023 at 9:50 am.  Please call the clinic if you need to reschedule. Contact information: [address] [place] [address] [phone]         [facility] Follow up.   Why: Oxygen to be delivered to the room. Contact information: [place] [address] [phone]                  Time coordinating discharge: 39 minutes  Signed:  Yared Barefoot  [facility] 01/05/2023, 2:47 PM

## 2023-01-06 DIAGNOSIS — N179 Acute kidney failure, unspecified: Secondary | ICD-10-CM | POA: Diagnosis not present

## 2023-01-06 DIAGNOSIS — J9601 Acute respiratory failure with hypoxia: Secondary | ICD-10-CM | POA: Diagnosis not present

## 2023-01-06 DIAGNOSIS — J101 Influenza due to other identified influenza virus with other respiratory manifestations: Secondary | ICD-10-CM | POA: Diagnosis not present

## 2023-01-06 DIAGNOSIS — J189 Pneumonia, unspecified organism: Secondary | ICD-10-CM | POA: Diagnosis not present

## 2023-01-06 NOTE — Plan of Care (Signed)

## 2023-01-06 NOTE — Plan of Care (Signed)

## 2023-01-06 NOTE — Care Management (Addendum)
01-06-23 [id number] Case Manager spoke with [facility] and states insurance was requesting prior authorization for the oxygen-office is trying to obtain this morning. Case Manager will continue to follow for transition of care needs.    1406 01-06-23 Case Manager just spoke with [employer] and the oxygen has not been approved- need additional diagnosis in progress note. Case Manager reached out to MD for additional updates. Continuing to monitor.   1520 01-06-23 [employer] having difficulty getting oxygen approved. Case Manager will continue to follow for transition of care needs.   01-06-23 1530 Case Manager received a message that [employer] will not be able to assist the patient with DME oxygen if she does not have a diagnosis for chronic hypoxic respiratory failure. MD aware that the patient will have to be weaned prior to discharge. Case Manager will continue to follow for additional needs.

## 2023-01-06 NOTE — Progress Notes (Addendum)
PROGRESS NOTE    Bonnie Douglas  [id number] DOB: October 01, 1966 DOA: 12/28/2022 PCP: System, Provider Not In    Brief Narrative:  57 years old female with past medical history of woman PMH including smoker, previous heroin use now on methadone, recent visit to the ED for influenza, presented with nausea, vomiting, diarrhea, myalgias, cough.  Hypoxic in the emergency department.  Admitted for acute hypoxic respiratory failure, influenza A, AKI, sepsis secondary to pneumonia. Started on IVF and tamiflu and doxycycline.  Developed ARDS type picture and was transferred to the critical care service for worsening respiratory distress.  Subsequently patient was weaned down on oxygen and transferred to medical floor.    At this time, patient is clinically improved but is on supplemental oxygen.  Is awaiting for oxygen arrangements prior to discharge but discharge has been delayed due to lack of insurance and oxygen arrangements.  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  Patient has qualified for home oxygen on discharge pending oxygen arrangement.     Severe Sepsis (with AKI, elevated lactate) secondary to Flu A PNA  Possible bacterial pneumonia superimposed with markedly elevated procalcitonin Currently requiring 2 L of oxygen and will be prescribed on discharge.  Patient has received Rocephin Tamiflu prednisone during hospitalization has completed course..  CT scan showed multifocal pneumonia, bronchitis, trace bilateral effusions, mediastinal and R hilar lymphadenopathy.  At this time, patient will follow-up with PCP as outpatient.   Trace Compression Fractures Continue supportive care.   AKI/Metabolic acidosis  Resolved.  Creatinine on discharge was 0.7.   Normocytic anemia /Leukopenia/Thrombocytopenia Thrombocytopenia resolved.   Mild anemia present.  Will need to follow-up as outpatient.   PMH heroin, polysubstance use/35 pack year history of smoking Continue chronic methadone.  Continue nicotine patch on discharge.  Will benefit from outpatient PFT and lung cancer screening.    Chronic pain Continue methadone/Robaxin   Nausea/ vomiting/diarrhea, resolved  Hypokalemia, resolved   Essential hypertension Continue metoprolol, amlodipine.   DVT prophylaxis: heparin injection 5,000 Units Start: 12/28/22 1400   Code Status:     Code Status: Full Code  Disposition: Home with home oxygen, pending arrangement.  Status is: Inpatient Remains inpatient appropriate because: Respiratory failure requiring oxygen   Family Communication: None at bedside  Consultants:  PCCM  Procedures:  None  Antimicrobials:  None currently   Subjective: Today, patient was seen and examined at bedside.  Patient has some shortness of breath.  Continues to feel better.  Wants to go home but awaiting for oxygen, oxygen has not been approved.  Objective: Vitals:   01/06/23 0300 01/06/23 0400 01/06/23 0512 01/06/23 0941  BP:   132/84 125/75  Pulse: 86 94 92 84  Resp:   18   Temp:   98 F (36.7 C)   TempSrc:   Oral   SpO2:  94% 98%   Weight:      Height:        Intake/Output Summary (Last 24 hours) at 01/06/2023 1708 Last data filed at 01/06/2023 0830 Gross per 24 hour  Intake 240 ml  Output --  Net 240 ml   Filed Weights   12/28/22 0253 12/28/22 0852  Weight: 86.2 kg 94.5 kg    Physical Examination: Body mass index is 30.77 kg/m.  General: He is built, not in obvious distress, on nasal cannula oxygen HENT:   No scleral  pallor or icterus noted. Oral mucosa is moist.  Chest:   Diminished breath sounds bilaterally.  CVS: S1 &S2 heard. No murmur.  Regular rate and rhythm. Abdomen: Soft, nontender, nondistended.  Bowel sounds are heard.   Extremities: No cyanosis, clubbing or edema.  Peripheral pulses are  palpable. Psych: Alert, awake and oriented, normal mood CNS:  No cranial nerve deficits.  Power equal in all extremities.   Skin: Warm and dry.  No rashes noted.  Data Reviewed:   CBC: Recent Labs  Lab 12/31/22 0816 01/01/23 0253 01/02/23 0247 01/04/23 0308 01/05/23 0546  WBC 16.4* 18.5* 16.1* 13.8* 10.3  HGB 9.6* 8.8* 8.9* 9.1* 8.4*  HCT 28.6* 26.6* 28.4* 27.5* 25.0*  MCV 88.8 89.3 91.9 91.1 92.3  PLT 155 162 185 291 [id number]    Basic Metabolic Panel: Recent Labs  Lab 12/31/22 0816 01/01/23 0253 01/02/23 0247 01/04/23 0308 01/05/23 0546  NA 137 139 137 137 136  K 3.2* 3.6 3.7 3.5 3.7  CL 102 105 103 103 101  CO2 '25 25 26 30 27  '$ GLUCOSE 83 81 169* 81 88  BUN 39* 32* 23* 11 9  CREATININE 1.00 0.91 0.91 0.83 0.75  CALCIUM 8.9 8.8* 8.8* 8.5* 8.4*  MG 2.2 2.1 1.7 1.3* 1.8  PHOS  --   --   --  3.4 3.6    Liver Function Tests: No results for input(s): "AST", "ALT", "ALKPHOS", "BILITOT", "PROT", "ALBUMIN" in the last 168 hours.   Radiology Studies: No results found.    LOS: 9 days    Flora Lipps, MD [facility] Available via Epic secure chat 7am-7pm After these hours, please refer to coverage provider listed on [email or web address] 01/06/2023, 5:08 PM

## 2023-01-06 NOTE — Progress Notes (Signed)
OK to be off tele and no IV per Dr. Louanne Belton

## 2023-01-07 DIAGNOSIS — J189 Pneumonia, unspecified organism: Secondary | ICD-10-CM | POA: Diagnosis not present

## 2023-01-07 DIAGNOSIS — J101 Influenza due to other identified influenza virus with other respiratory manifestations: Secondary | ICD-10-CM | POA: Diagnosis not present

## 2023-01-07 DIAGNOSIS — N179 Acute kidney failure, unspecified: Secondary | ICD-10-CM | POA: Diagnosis not present

## 2023-01-07 DIAGNOSIS — J9601 Acute respiratory failure with hypoxia: Secondary | ICD-10-CM | POA: Diagnosis not present

## 2023-01-07 LAB — CBC
HCT: 23.6 % — ABNORMAL LOW (ref 36.0–46.0)
Hemoglobin: 7.9 g/dL — ABNORMAL LOW (ref 12.0–15.0)
MCH: 32 pg (ref 26.0–34.0)
MCHC: 33.5 g/dL (ref 30.0–36.0)
MCV: 95.5 fL (ref 80.0–100.0)
Platelets: 306 10*3/uL (ref 150–400)
RBC: 2.47 MIL/uL — ABNORMAL LOW (ref 3.87–5.11)
RDW: 16.2 % — ABNORMAL HIGH (ref 11.5–15.5)
WBC: 6.5 10*3/uL (ref 4.0–10.5)
nRBC: 0 % (ref 0.0–0.2)

## 2023-01-07 LAB — BASIC METABOLIC PANEL
Anion gap: 8 (ref 5–15)
BUN: 8 mg/dL (ref 6–20)
CO2: 25 mmol/L (ref 22–32)
Calcium: 8.5 mg/dL — ABNORMAL LOW (ref 8.9–10.3)
Chloride: 104 mmol/L (ref 98–111)
Creatinine, Ser: 0.85 mg/dL (ref 0.44–1.00)
GFR, Estimated: >60 mL/min (ref >60)
Glucose, Bld: 100 mg/dL — ABNORMAL HIGH (ref 70–99)
Potassium: 3.6 mmol/L (ref 3.5–5.1)
Sodium: 137 mmol/L (ref 135–145)

## 2023-01-07 NOTE — TOC Progression Note (Addendum)
Transition of Care [facility]) - Progression Note    Patient Details  Name: Bonnie Douglas MRN: [id number] Date of Birth: Nov 07, 1965  Transition of Care [facility]) CM/SW Contact  Curlene Labrum, RN Phone Number: 01/07/2023, 2:52 PM  Clinical Narrative:    CM met with the patient at the bedside and the patient's daughter filled out [facility] Medicaid application today for the patient.  The patient states that she needs a new flutter valve, bedside nursing was notified.  The patient is currently on room air with a productive cough.  I updated the patient that DME company was unable to bill the patient's out of state Medicaid.  I spoke with Barbette Or, [facility] Supervisor and she will speak with hospital legal team to explore option of charity if needed.  01/07/2023 1615 - CM called Caryl Pina, CM with [place] and he will check with the [place] office with [employer] to check if they can bill for the patient's oxygen.  Will follow up with [employer] in the am.   Expected Discharge Plan: Home/Self Care Barriers to Discharge: No Barriers Identified  Expected Discharge Plan and Services   Discharge Planning Services: CM Consult Post Acute Care Choice: Resumption of Svcs/PTA Provider Living arrangements for the past 2 months: Apartment (Patient currently living with her daughter at [address] [place], [place] [address]) Expected Discharge Date: 01/05/23                                     Social Determinants of Health (SDOH) Interventions SDOH Screenings   Food Insecurity: Unknown (12/29/2022)  Housing: Low Risk  (12/29/2022)  Transportation Needs: No Transportation Needs (12/30/2022)  Utilities: Not At Risk (12/30/2022)  Tobacco Use: High Risk (12/30/2022)    Readmission Risk Interventions    12/29/2022    4:19 PM  Readmission Risk Prevention Plan  Transportation Screening Complete  PCP or Specialist Appt within 5-7 Days Complete  Home Care Screening Complete  Medication Review (RN CM)  Complete

## 2023-01-07 NOTE — Progress Notes (Signed)
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  Hypoxic in the emergency department.  Admitted for acute hypoxic respiratory failure, influenza A, AKI, sepsis secondary to pneumonia. Started on IVF and tamiflu and doxycycline.  Developed ARDS type picture and was transferred to the critical care service for worsening respiratory distress.  Subsequently patient was weaned down on oxygen and transferred to medical floor.    At this time, patient is clinically improved but is on supplemental oxygen.  Is awaiting for oxygen arrangements prior to discharge but discharge has been delayed due to lack of insurance and oxygen arrangements.  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  Patient has qualified for home oxygen on discharge pending oxygen arrangement.  Has been difficult to arrange one but will continue to wean oxygen as able.   Severe Sepsis (with AKI, elevated lactate) secondary to Flu A PNA  Possible bacterial pneumonia superimposed with markedly elevated procalcitonin Currently requiring 2 L of oxygen   Patient has received Rocephin Tamiflu prednisone during hospitalization has completed course..  CT scan showed multifocal pneumonia, bronchitis, trace bilateral effusions, mediastinal and R hilar lymphadenopathy.  At this time, patient will follow-up with PCP as outpatient.   Trace Compression Fractures Continue supportive care.   AKI/Metabolic acidosis  Resolved.  Creatinine noted at 0.7.   Normocytic anemia  /Leukopenia/Thrombocytopenia Thrombocytopenia resolved.  Mild anemia present.  Will need to follow-up as outpatient.   PMH heroin, polysubstance use/35 pack year history of smoking Continue chronic methadone.  Continue nicotine patch on discharge.  Will benefit from outpatient PFT and lung cancer screening.    Chronic pain Continue methadone/Robaxin   Nausea/ vomiting/diarrhea, resolved  Hypokalemia, resolved   Essential hypertension Continue metoprolol, amlodipine.  Blood pressure seems to be stable.   DVT prophylaxis: heparin injection 5,000 Units Start: 12/28/22 1400   Code Status:     Code Status: Full Code  Disposition: Home with home oxygen, pending arrangement.  Status is: Inpatient  Remains inpatient appropriate because: Respiratory failure requiring oxygen   Family Communication: None at bedside  Consultants:  PCCM  Procedures:  None  Antimicrobials:  None currently   Subjective: Today, patient was seen and examined at bedside denies any nausea, vomiting, fever, chills or rigor.  Denies cough, chest pain or increasing dyspnea.  Objective: Vitals:   01/06/23 2000 01/06/23 2012 01/07/23 0543 01/07/23 0837  BP:  117/77 130/84 139/80  Pulse:  100 95 86  Resp:   18 16  Temp:  97.8 F (36.6 C) 97.8 F (36.6 C) 97.9 F (36.6 C)  TempSrc:  Oral Oral Oral  SpO2: 96% 92% 91% 92%  Weight:      Height:       No intake or output data in the 24 hours ending 01/07/23 1004  Filed Weights   12/28/22 0253 12/28/22 0852  Weight: 86.2 kg 94.5 kg    Physical Examination: Body mass index is 30.77 kg/m.   General: Obese not in obvious distress, on nasal cannula oxygen HENT:   No scleral pallor or icterus noted. Oral mucosa is moist.  Chest:   Diminished breath sounds bilaterally.  CVS: S1 &S2 heard. No murmur.  Regular rate and rhythm. Abdomen: Soft, nontender, nondistended.  Bowel sounds are heard.   Extremities: No cyanosis, clubbing or edema.  Peripheral  pulses are palpable. Psych: Alert, awake and oriented, normal mood CNS:  No cranial nerve deficits.  Power equal in all extremities.   Skin: Warm and dry.  No rashes noted.  Data Reviewed:   CBC: Recent Labs  Lab 01/01/23 0253 01/02/23 0247 01/04/23 0308 01/05/23 0546 01/07/23 0538  WBC 18.5* 16.1* 13.8* 10.3 6.5  HGB 8.8* 8.9* 9.1* 8.4* 7.9*  HCT 26.6* 28.4* 27.5* 25.0* 23.6*  MCV 89.3 91.9 91.1 92.3 95.5  PLT 162 185 291 285 306     Basic Metabolic Panel: Recent Labs  Lab 01/01/23 0253 01/02/23 0247 01/04/23 0308 01/05/23 0546 01/07/23 0538  NA 139 137 137 136 137  K 3.6 3.7 3.5 3.7 3.6  CL 105 103 103 101 104  CO2 '25 26 30 27 25  '$ GLUCOSE 81 169* 81 88 100*  BUN 32* 23* '11 9 8  '$ CREATININE 0.91 0.91 0.83 0.75 0.85  CALCIUM 8.8* 8.8* 8.5* 8.4* 8.5*  MG 2.1 1.7 1.3* 1.8  --   PHOS  --   --  3.4 3.6  --      Liver Function Tests: No results for input(s): "AST", "ALT", "ALKPHOS", "BILITOT", "PROT", "ALBUMIN" in the last 168 hours.   Radiology Studies: No results found.    LOS: 10 days    Flora Lipps, MD [facility] Available via Epic secure chat 7am-7pm After these hours, please refer to coverage provider listed on [email or web address] 01/07/2023, 10:04 AM

## 2023-01-08 ENCOUNTER — Other Ambulatory Visit (HOSPITAL_COMMUNITY): Payer: Self-pay

## 2023-01-08 DIAGNOSIS — N179 Acute kidney failure, unspecified: Secondary | ICD-10-CM | POA: Diagnosis not present

## 2023-01-08 DIAGNOSIS — J9601 Acute respiratory failure with hypoxia: Secondary | ICD-10-CM | POA: Diagnosis not present

## 2023-01-08 DIAGNOSIS — J189 Pneumonia, unspecified organism: Secondary | ICD-10-CM | POA: Diagnosis not present

## 2023-01-08 DIAGNOSIS — J101 Influenza due to other identified influenza virus with other respiratory manifestations: Secondary | ICD-10-CM | POA: Diagnosis not present

## 2023-01-08 LAB — CBC
HCT: 22.6 % — ABNORMAL LOW (ref 36.0–46.0)
Hemoglobin: 8.1 g/dL — ABNORMAL LOW (ref 12.0–15.0)
MCH: 35.4 pg — ABNORMAL HIGH (ref 26.0–34.0)
MCHC: 35.8 g/dL (ref 30.0–36.0)
MCV: 98.7 fL (ref 80.0–100.0)
Platelets: 307 10*3/uL (ref 150–400)
RBC: 2.29 MIL/uL — ABNORMAL LOW (ref 3.87–5.11)
RDW: 16 % — ABNORMAL HIGH (ref 11.5–15.5)
WBC: 6.7 10*3/uL (ref 4.0–10.5)
nRBC: 0 % (ref 0.0–0.2)

## 2023-01-08 LAB — BASIC METABOLIC PANEL
Anion gap: 9 (ref 5–15)
BUN: 9 mg/dL (ref 6–20)
CO2: 24 mmol/L (ref 22–32)
Calcium: 8.6 mg/dL — ABNORMAL LOW (ref 8.9–10.3)
Chloride: 100 mmol/L (ref 98–111)
Creatinine, Ser: 0.8 mg/dL (ref 0.44–1.00)
GFR, Estimated: >60 mL/min (ref >60)
Glucose, Bld: 83 mg/dL (ref 70–99)
Potassium: 3.8 mmol/L (ref 3.5–5.1)
Sodium: 133 mmol/L — ABNORMAL LOW (ref 135–145)

## 2023-01-08 MED ORDER — ALBUTEROL SULFATE HFA 108 (90 BASE) MCG/ACT IN AERS
2.0000 | INHALATION_SPRAY | Freq: Four times a day (QID) | RESPIRATORY_TRACT | 2 refills | Status: AC | PRN
  Filled 2023-01-08: qty 6.7, 30d supply, fill #0

## 2023-01-08 MED ORDER — NICOTINE 21 MG/24HR TD PT24
21.0000 mg | MEDICATED_PATCH | Freq: Every day | TRANSDERMAL | 0 refills | Status: AC
  Filled 2023-01-08: qty 28, 28d supply, fill #0

## 2023-01-08 MED ORDER — NICOTINE 21 MG/24HR TD PT24
21.0000 mg | MEDICATED_PATCH | Freq: Every day | TRANSDERMAL | 0 refills | Status: DC
  Filled 2023-01-08: qty 28, 28d supply, fill #0

## 2023-01-08 NOTE — Plan of Care (Signed)

## 2023-01-08 NOTE — TOC Transition Note (Signed)
Transition of Care [facility]) - CM/SW Discharge Note   Patient Details  Name: Bonnie Douglas MRN: [id number] Date of Birth: November 29, 1965  Transition of Care [facility]) CM/SW Contact:  Curlene Labrum, RN Phone Number: 01/08/2023, 1:30 PM   Clinical Narrative:    CM met with the patient at the bedside after ambulation test for oxygen needs.  The patient was able to maintain her oxygen saturation and will not need oxygen for home.  I spoke with the patient at the bedside and asked to follow up regarding [place] Medicaid application.    [place] will not bee needed for oxygen for home at this time but I made the patient aware that the company has DME company out of [place] and should the patient need oxygen in the future that the facility would be able to assist.  MD will place discharge orders and meds to be provided through [place].  Match is not needed due to low cost of medications through [place] and patient can be billed for medications.  The patient's daughter should be called by bedside nursing to provide the patient's transportation to home.   Final next level of care: Home/Self Care Barriers to Discharge: No Barriers Identified   Patient Goals and CMS Choice CMS Medicare.gov Compare Post Acute Care list provided to:: Patient Represenative (must comment) (patient's daughter Bonnie Douglas [id number]) Choice offered to / list presented to : Adult Children  Discharge Placement                         Discharge Plan and Services Additional resources added to the After Visit Summary for     Discharge Planning Services: CM Consult Post Acute Care Choice: Resumption of Svcs/PTA Provider                               Social Determinants of Health (SDOH) Interventions SDOH Screenings   Food Insecurity: Patient Declined (12/29/2022)  Housing: Low Risk  (12/29/2022)  Transportation Needs: No Transportation Needs (12/30/2022)  Utilities: Not At Risk  (12/30/2022)  Tobacco Use: High Risk (12/30/2022)     Readmission Risk Interventions    12/29/2022    4:19 PM  Readmission Risk Prevention Plan  Transportation Screening Complete  PCP or Specialist Appt within 5-7 Days Complete  Home Care Screening Complete  Medication Review (RN CM) Complete

## 2023-01-08 NOTE — Discharge Summary (Signed)
Physician Discharge Summary  Bonnie Douglas [id number] DOB: [birth year] DOA: 12/28/2022  PCP: System, Provider Not In  Admit date: 12/28/2022 Discharge date: 01/06/2023  Admitted From: Home  Discharge disposition: Home  Recommendations for Outpatient Follow-Up:   Follow up with your primary care provider in one week.   Patient would benefit from  imaging of lungs in 4 to 6 weeks. Check CBC, BMP, magnesium in the next visit Patient will benefit from outpatient PFT and lung cancer screening.  Discharge Diagnosis:   Principal Problem:   Acute respiratory failure with hypoxia (HCC) Active Problems:   Sepsis due to pneumonia ([place])   Influenza A   AKI (acute kidney injury) ([place])   Nausea, vomiting, and diarrhea   Hypokalemia   Hypocalcemia   History of substance abuse ([place])   Thrombocytopenia ([place])  Discharge Condition: Improved.  Diet recommendation: Low sodium, heart healthy.    Wound care: None.  Code status: Full.   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  Hypoxic in the emergency department.  Admitted for acute hypoxic respiratory failure, influenza A, AKI, sepsis secondary to pneumonia. Started on IVF and tamiflu and doxycycline.  Developed ARDS type picture and was transferred to the critical care service for worsening respiratory distress.  Subsequently, patient was transferred out of the ICU.  Hospital Course:   Following conditions were addressed during hospitalization as listed below,  Acute hypoxic respiratory failure Resolved at this time.  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  Patient had a prolonged course in the hospital with continued attempts at weaning oxygen.  At this  time patient has not required any oxygen and is able to ambulate without desaturation.  Severe Sepsis (with AKI, elevated lactate) secondary to Flu A PNA  Possible bacterial pneumonia superimposed with markedly elevated procalcitonin Patient has received Rocephin Tamiflu prednisone during hospitalization.  CT scan showed multifocal pneumonia, bronchitis, trace bilateral effusions, mediastinal and R hilar lymphadenopathy.  At this time, patient will follow-up with PCP as outpatient.   Trace Compression Fractures Continue supportive care.   AKI/Metabolic acidosis  Resolved.  Creatinine on discharge was 0.8.  Normocytic anemia /Leukopenia/Thrombocytopenia Thrombocytopenia resolved.  Mild anemia present.  Will need to follow-up as outpatient.   PMH heroin, polysubstance use/35 pack year history of smoking Continue chronic methadone.  Continue nicotine patch on discharge.  Will benefit from outpatient PFT and lung cancer screening.   Chronic pain Received methadone/Robaxin during hospitalization.   Nausea/ vomiting/diarrhea, resolved Hypokalemia, resolved   Essential hypertension Continue metoprolol, amlodipine.  Disposition.  At this time, patient is stable for disposition with outpatient PCP follow-up.  Medical Consultants:   PCCM  Procedures:    None Subjective:   Today, was seen and examined at bedside.  Denies interval complaints.  Was able to ambulate without need for oxygen.  Discharge Exam:   Vitals:   01/08/23 0510 01/08/23 0728  BP: (!) 144/89 133/79  Pulse: 78 81  Resp: 17 17  Temp: 98.2 F (36.8 C) 98.2 F (36.8 C)  SpO2: 92% 92%   Vitals:   01/07/23 1611 01/07/23 2125 01/08/23 0510 01/08/23 0728  BP: 123/79 114/83 (!) 144/89 133/79  Pulse: 88 85 78 81  Resp: '17 17 17 17  '$ Temp: 98.3 F (36.8 C)  98.4 F (36.9 C) 98.2 F (36.8 C) 98.2 F (36.8 C)  TempSrc: Oral Oral  Oral  SpO2: 91% 94% 92% 92%  Weight:      Height:       General: Alert awake,  not in obvious distress,  HENT: pupils equally reacting to light,  No scleral pallor or icterus noted. Oral mucosa is moist.  Chest: Diminished breath sounds bilaterally.  No crackles or wheezes.  CVS: S1 &S2 heard. No murmur.  Regular rate and rhythm. Abdomen: Soft, nontender, nondistended.  Bowel sounds are heard.   Extremities: No cyanosis, clubbing or edema.  Peripheral pulses are palpable. Psych: Alert, awake and oriented, normal mood CNS:  No cranial nerve deficits.  Power equal in all extremities.   Skin: Warm and dry.  No rashes noted.  The results of significant diagnostics from this hospitalization (including imaging, microbiology, ancillary and laboratory) are listed below for reference.     Diagnostic Studies:   VAS [place] LOWER EXTREMITY VENOUS (DVT)  Result Date: 12/29/2022  Lower Venous DVT Study Patient Name:  Bonnie Douglas [facility]  Date of Exam:   12/29/2022 Medical Rec #: [id number]      Accession #:    [id number] Date of Birth: 1966/02/11      Patient Gender: F Patient Age:   41 years Exam Location:  [facility] Procedure:      VAS [place] LOWER EXTREMITY VENOUS (DVT) Referring Phys: Ina Homes --------------------------------------------------------------------------------  Indications: Edema, and SOB.  Comparison Study: No prior study. Performing Technologist: McKayla Maag RVT, [place]  Examination Guidelines: A complete evaluation includes B-mode imaging, spectral Doppler, color Doppler, and power Doppler as needed of all accessible portions of each vessel. Bilateral testing is considered an integral part of a complete examination. Limited examinations for reoccurring indications may be performed as noted. The reflux portion of the exam is performed with the patient in reverse Trendelenburg.  +---------+---------------+---------+-----------+----------+--------------+ RIGHT    CompressibilityPhasicitySpontaneityPropertiesThrombus Aging  +---------+---------------+---------+-----------+----------+--------------+ CFV      Full           Yes      Yes                                 +---------+---------------+---------+-----------+----------+--------------+ SFJ      Full                                                        +---------+---------------+---------+-----------+----------+--------------+ FV Prox  Full                                                        +---------+---------------+---------+-----------+----------+--------------+ FV Mid   Full                                                        +---------+---------------+---------+-----------+----------+--------------+ FV DistalFull                                                        +---------+---------------+---------+-----------+----------+--------------+  PFV      Full                                                        +---------+---------------+---------+-----------+----------+--------------+ POP      Full           Yes      Yes                                 +---------+---------------+---------+-----------+----------+--------------+ PTV      Full                                                        +---------+---------------+---------+-----------+----------+--------------+ PERO     Full                                                        +---------+---------------+---------+-----------+----------+--------------+   +---------+---------------+---------+-----------+----------+--------------+ LEFT     CompressibilityPhasicitySpontaneityPropertiesThrombus Aging +---------+---------------+---------+-----------+----------+--------------+ CFV      Full           Yes      Yes                                 +---------+---------------+---------+-----------+----------+--------------+ SFJ      Full                                                         +---------+---------------+---------+-----------+----------+--------------+ FV Prox  Full                                                        +---------+---------------+---------+-----------+----------+--------------+ FV Mid   Full                                                        +---------+---------------+---------+-----------+----------+--------------+ FV DistalFull                                                        +---------+---------------+---------+-----------+----------+--------------+ PFV      Full                                                        +---------+---------------+---------+-----------+----------+--------------+  POP      Full           Yes      Yes                                 +---------+---------------+---------+-----------+----------+--------------+ PTV      Full                                                        +---------+---------------+---------+-----------+----------+--------------+ PERO     Full                                                        +---------+---------------+---------+-----------+----------+--------------+     Summary: BILATERAL: - No evidence of deep vein thrombosis seen in the lower extremities, bilaterally. - No evidence of superficial venous thrombosis in the lower extremities, bilaterally. -No evidence of popliteal cyst, bilaterally.   *See table(s) above for measurements and observations. Electronically signed by Servando Snare MD on 12/29/2022 at 7:24:40 PM.    Final    DG Chest Port 1 View  Result Date: 12/29/2022 CLINICAL DATA:  ARDS EXAM: PORTABLE CHEST 1 VIEW COMPARISON:  CXR 12/28/22 FINDINGS: No pleural effusion. No pneumothorax. Unchanged cardiac and mediastinal contours. Slight interval increase in bibasilar airspace opacities. Possible trace left-sided pleural effusion. No radiographically apparent displaced rib fractures. Visualized upper abdomen is unremarkable. IMPRESSION: 1.  Increased bibasilar airspace opacities, right greater than left. 2. Possible trace left-sided pleural effusion. Electronically Signed   By: Marin Roberts M.D.   On: 12/29/2022 07:49   US RENAL  Result Date: 12/28/2022 CLINICAL DATA:  Acute kidney injury EXAM: RENAL / URINARY TRACT ULTRASOUND COMPLETE COMPARISON:  CT noncontrast 03/29/2021 FINDINGS: Right Kidney: Renal measurements: 8.7 x 4.9 x 6.0 = volume: 131.4 mL. Echogenicity within normal limits. No mass or hydronephrosis visualized. Left Kidney: Renal measurements: 11.4 x 5.5 x 5.7 = volume: 188.1 mL. Echogenicity within normal limits. No mass or hydronephrosis visualized. Bladder: Distended bladder.  Preserved contour. Other: None. IMPRESSION: No collecting system dilatation Electronically Signed   By: Jill Side M.D.   On: 12/28/2022 13:16   DG Chest Port 1 View  Result Date: 12/28/2022 CLINICAL DATA:  Influenza.  Nausea, vomiting, and diarrhea. EXAM: PORTABLE CHEST 1 VIEW COMPARISON:  12/25/2022. FINDINGS: The heart size and mediastinal contours are within normal limits. Patchy airspace disease is noted in the mid to lower lung fields bilaterally, greater on the right than on the left. No effusion or pneumothorax. No acute osseous abnormality. IMPRESSION: Patchy airspace disease in the mid to lower lung fields bilaterally, concerning for pneumonia. Electronically Signed   By: Brett Fairy M.D.   On: 12/28/2022 03:31     Labs:   Basic Metabolic Panel: Recent Labs  Lab 01/02/23 0247 01/04/23 0308 01/05/23 0546 01/07/23 0538 01/08/23 0324  NA 137 137 136 137 133*  K 3.7 3.5 3.7 3.6 3.8  CL 103 103 101 104 100  CO2 '26 30 27 25 24  '$ GLUCOSE 169* 81 88 100* 83  BUN 23* '11 9 8 9  '$ CREATININE 0.91 0.83 0.75 0.85  0.80  CALCIUM 8.8* 8.5* 8.4* 8.5* 8.6*  MG 1.7 1.3* 1.8  --   --   PHOS  --  3.4 3.6  --   --    GFR Estimated Creatinine Clearance: 93.8 mL/min (by C-G formula based on SCr of 0.8 mg/dL). Liver Function Tests: No results  for input(s): "AST", "ALT", "ALKPHOS", "BILITOT", "PROT", "ALBUMIN" in the last 168 hours. No results for input(s): "LIPASE", "AMYLASE" in the last 168 hours. No results for input(s): "AMMONIA" in the last 168 hours. Coagulation profile No results for input(s): "INR", "PROTIME" in the last 168 hours.  CBC: Recent Labs  Lab 01/02/23 0247 01/04/23 0308 01/05/23 0546 01/07/23 0538 01/08/23 0324  WBC 16.1* 13.8* 10.3 6.5 6.7  HGB 8.9* 9.1* 8.4* 7.9* 8.1*  HCT 28.4* 27.5* 25.0* 23.6* 22.6*  MCV 91.9 91.1 92.3 95.5 98.7  PLT 185 291 285 306 307   Cardiac Enzymes: No results for input(s): "CKTOTAL", "CKMB", "CKMBINDEX", "TROPONINI" in the last 168 hours. BNP: Invalid input(s): "POCBNP" CBG: No results for input(s): "GLUCAP" in the last 168 hours. D-Dimer No results for input(s): "DDIMER" in the last 72 hours. Hgb A1c No results for input(s): "HGBA1C" in the last 72 hours. Lipid Profile No results for input(s): "CHOL", "HDL", "LDLCALC", "TRIG", "CHOLHDL", "LDLDIRECT" in the last 72 hours. Thyroid function studies No results for input(s): "TSH", "T4TOTAL", "T3FREE", "THYROIDAB" in the last 72 hours.  Invalid input(s): "FREET3" Anemia work up No results for input(s): "VITAMINB12", "FOLATE", "FERRITIN", "TIBC", "IRON", "RETICCTPCT" in the last 72 hours. Microbiology No results found for this or any previous visit (from the past 240 hour(s)).    Discharge Instructions:   Discharge Instructions     Call MD for:  temperature >100.4   Complete by: As directed    Diet general   Complete by: As directed    Discharge instructions   Complete by: As directed    Follow-up with your primary care provider in 1 week.  No smoking.   Continue to use inhaler at home as necessary.  No overexertion.  Seek medical attention for worsening symptoms.   Increase activity slowly   Complete by: As directed    Avoid overexertion.         Follow-up Information     Gildardo Pounds, NP. Go  on 01/14/2023.   Specialty: Nurse Practitioner Why: You are scheduled for a hospital follow up on January 14, 2023 at 9:50 am.  Please call the clinic if you need to reschedule. Contact information: [place] [place] [address] [phone]                  Time coordinating discharge: 39 minutes  Signed:  Ardie Mclennan  [facility] 01/08/2023, 12:50 PM

## 2023-01-14 ENCOUNTER — Inpatient Hospital Stay: Payer: No Typology Code available for payment source | Admitting: Nurse Practitioner

## 2023-01-19 ENCOUNTER — Other Ambulatory Visit (HOSPITAL_COMMUNITY): Payer: Self-pay

## 2023-01-19 ENCOUNTER — Telehealth: Payer: Self-pay

## 2023-01-19 NOTE — Telephone Encounter (Signed)
Copied from [place]. Topic: General - Other >> Jan 19, 2023  8:32 AM Leone Payor F wrote: Reason for CRM: Pt's daughter is calling because pt needs a Hospital Follow Up. Pt was discharged from [facility] on 01/08/23 and had an appointment on 01/14/23 and wasn't feeling good so she couldn't make it. Please follow up with pt's daughter-(Shalisha)  [phone]

## 2023-01-29 ENCOUNTER — Ambulatory Visit (HOSPITAL_COMMUNITY)
Admission: RE | Admit: 2023-01-29 | Discharge: 2023-01-29 | Disposition: A | Discharge status: Home / Self Care | Payer: Medicaid - Out of State | Source: Ambulatory Visit | Attending: Nurse Practitioner | Admitting: Nurse Practitioner

## 2023-01-29 ENCOUNTER — Encounter: Payer: Self-pay | Admitting: Nurse Practitioner

## 2023-01-29 ENCOUNTER — Ambulatory Visit (INDEPENDENT_AMBULATORY_CARE_PROVIDER_SITE_OTHER): Payer: No Typology Code available for payment source | Admitting: Nurse Practitioner

## 2023-01-29 VITALS — BP 101/71 | HR 65 | Temp 97.5°F | Ht 69.0 in | Wt 193.6 lb

## 2023-01-29 DIAGNOSIS — J9601 Acute respiratory failure with hypoxia: Secondary | ICD-10-CM | POA: Diagnosis not present

## 2023-01-29 DIAGNOSIS — A419 Sepsis, unspecified organism: Secondary | ICD-10-CM

## 2023-01-29 DIAGNOSIS — J189 Pneumonia, unspecified organism: Secondary | ICD-10-CM | POA: Diagnosis not present

## 2023-01-29 DIAGNOSIS — S22050D Wedge compression fracture of T5-T6 vertebra, subsequent encounter for fracture with routine healing: Secondary | ICD-10-CM | POA: Diagnosis not present

## 2023-01-29 DIAGNOSIS — R5381 Other malaise: Secondary | ICD-10-CM

## 2023-01-29 DIAGNOSIS — F17200 Nicotine dependence, unspecified, uncomplicated: Secondary | ICD-10-CM

## 2023-01-29 DIAGNOSIS — R413 Other amnesia: Secondary | ICD-10-CM

## 2023-01-29 DIAGNOSIS — Z1231 Encounter for screening mammogram for malignant neoplasm of breast: Secondary | ICD-10-CM

## 2023-01-29 DIAGNOSIS — Z85048 Personal history of other malignant neoplasm of rectum, rectosigmoid junction, and anus: Secondary | ICD-10-CM

## 2023-01-29 NOTE — Progress Notes (Signed)
@Patient  ID: Bonnie Douglas, female    DOB: Mar 15, 1966, 57 y.o.   MRN: [id number]  Chief Complaint  Patient presents with   [facility] follow up. Pt stated that since she been discharged from the hospital she have trouble remember.    Referring provider: No ref. provider found  Recent significant events:  Hospital admission: 12/28/22  Hospital course:  Acute hypoxic respiratory failure Resolved at this time.  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  Patient had a prolonged course in the hospital with continued attempts at weaning oxygen.  At this time patient has not required any oxygen and is able to ambulate without desaturation.   Severe Sepsis (with AKI, elevated lactate) secondary to Flu A PNA  Possible bacterial pneumonia superimposed with markedly elevated procalcitonin Patient has received Rocephin Tamiflu prednisone during hospitalization.  CT scan showed multifocal pneumonia, bronchitis, trace bilateral effusions, mediastinal and R hilar lymphadenopathy.  At this time, patient will follow-up with PCP as outpatient.   Trace Compression Fractures Continue supportive care.   AKI/Metabolic acidosis  Resolved.  Creatinine on discharge was 0.8.   Normocytic anemia /Leukopenia/Thrombocytopenia Thrombocytopenia resolved.  Mild anemia present.  Will need to follow-up as outpatient.   PMH heroin, polysubstance use/35 pack year history of smoking Continue chronic methadone.  Continue nicotine patch on discharge.  Will benefit from outpatient PFT and lung cancer screening.    Chronic pain Received methadone/Robaxin during hospitalization.   Nausea/ vomiting/diarrhea, resolved Hypokalemia, resolved   Essential hypertension Continue metoprolol, amlodipine.  HPI  Patient presents today for hospital follow-up.  Please see notes above.  Overall patient is  doing well since hospital discharge.  She does have overall physical deconditioning and is experiencing memory loss since being discharged.  We will refer her for physical therapy and speech therapy for cognitive rehabilitation.  As noted above patient does need to follow-up with pulmonary for PFT and lung cancer screening.  Will place this referral today.  Incidental finding on CT scan did show compression fractures to T5 vertebrae we will place referral to orthopedics.  Denies f/c/s, n/v/d, hemoptysis, PND, leg swelling Denies chest pain or edema         Allergies  Allergen Reactions   Biaxin [Clarithromycin] Nausea And Vomiting   Vicodin [Hydrocodone-Acetaminophen] Other (See Comments)    heacache   Latex Rash    Immunization History  Administered Date(s) Administered   Influenza,inj,Quad PF,6+ Mos 01/03/2023   PNEUMOCOCCAL CONJUGATE-20 01/03/2023    Past Medical History:  Diagnosis Date   Anal cancer    Arthritis    Depression    Fibromyalgia    Heroin abuse    Hypertension    Tobacco abuse 09/22/2012    Tobacco History: Social History   Tobacco Use  Smoking Status Every Day   Packs/day: .5   Types: Cigarettes  Smokeless Tobacco Not on file   Ready to quit: Not Answered Counseling given: Not Answered   Outpatient Encounter Medications as of 01/29/2023  Medication Sig   amLODipine (NORVASC) 5 MG tablet Take 2 tablets (10 mg total) by mouth daily.   metoprolol succinate (TOPROL-XL) 25 MG 24 hr tablet Take 1 tablet (25 mg total) by mouth daily.   albuterol (VENTOLIN HFA) 108 (90 Base) MCG/ACT inhaler Inhale 2 puffs into the lungs every 6 (six) hours as needed for wheezing or shortness of breath. (Patient not taking: Reported  on 01/29/2023)   benzonatate (TESSALON) 100 MG capsule Take 1 capsule (100 mg total) by mouth every 8 (eight) hours. (Patient not taking: Reported on 01/29/2023)   gabapentin (NEURONTIN) 300 MG capsule Take 600 mg by mouth 3 (three) times daily.  (Patient not taking: Reported on 01/29/2023)   ibuprofen (ADVIL) 600 MG tablet Take 1 tablet (600 mg total) by mouth every 6 (six) hours as needed. (Patient not taking: Reported on 01/29/2023)   methocarbamol (ROBAXIN) 500 MG tablet Take 1 tablet (500 mg total) by mouth 2 (two) times daily. (Patient not taking: Reported on 01/29/2023)   nicotine (NICODERM CQ - DOSED IN MG/24 HOURS) 21 mg/24hr patch Place 1 patch (21 mg total) onto the skin daily. (Patient not taking: Reported on 01/29/2023)   ondansetron (ZOFRAN-ODT) 4 MG disintegrating tablet Take 1 tablet (4 mg total) by mouth every 8 (eight) hours as needed for nausea or vomiting. (Patient not taking: Reported on 01/29/2023)   No facility-administered encounter medications on file as of 01/29/2023.     Review of Systems  Review of Systems  Constitutional: Negative.   HENT: Negative.    Cardiovascular: Negative.   Gastrointestinal: Negative.   Allergic/Immunologic: Negative.   Neurological: Negative.   Psychiatric/Behavioral: Negative.         Physical Exam  BP 101/71   Pulse 65   Temp (!) 97.5 F (36.4 C)   Ht 5\' 9"  (1.753 m)   Wt 193 lb 9.6 oz (87.8 kg)   SpO2 100%   BMI 28.59 kg/m   Wt Readings from Last 5 Encounters:  01/29/23 193 lb 9.6 oz (87.8 kg)  12/28/22 208 lb 5.4 oz (94.5 kg)  12/25/22 169 lb 15.6 oz (77.1 kg)  08/30/22 170 lb (77.1 kg)  04/07/21 200 lb (90.7 kg)     Physical Exam Vitals and nursing note reviewed.  Constitutional:      General: She is not in acute distress.    Appearance: She is well-developed.  Cardiovascular:     Rate and Rhythm: Normal rate and regular rhythm.  Pulmonary:     Effort: Pulmonary effort is normal.     Breath sounds: Normal breath sounds.  Neurological:     Mental Status: She is alert and oriented to person, place, and time.      Lab Results:  CBC    Component Value Date/Time   WBC 7.2 01/29/2023 1402   WBC 6.7 01/08/2023 0324   RBC 2.89 (L) 01/29/2023 1402   RBC  2.29 (L) 01/08/2023 0324   HGB 9.2 (L) 01/29/2023 1402   HCT 27.3 (L) 01/29/2023 1402   PLT 334 01/29/2023 1402   MCV 95 01/29/2023 1402   MCH 31.8 01/29/2023 1402   MCH 35.4 (H) 01/08/2023 0324   MCHC 33.7 01/29/2023 1402   MCHC 35.8 01/08/2023 0324   RDW 14.0 01/29/2023 1402   LYMPHSABS 0.3 (L) 12/28/2022 0320   MONOABS 0.0 (L) 12/28/2022 0320   EOSABS 0.0 12/28/2022 0320   BASOSABS 0.0 12/28/2022 0320    BMET    Component Value Date/Time   NA 139 01/29/2023 1402   K 4.6 01/29/2023 1402   CL 101 01/29/2023 1402   CO2 22 01/29/2023 1402   GLUCOSE 91 01/29/2023 1402   GLUCOSE 83 01/08/2023 0324   BUN 12 01/29/2023 1402   CREATININE 1.37 (H) 01/29/2023 1402   CALCIUM 10.1 01/29/2023 1402   GFRNONAA >60 01/08/2023 0324   GFRAA 74 (L) 08/31/2013 0819    BNP  Component Value Date/Time   BNP 77.0 12/25/2022 1120    ProBNP No results found for: "PROBNP"  Imaging: DG Chest 2 View  Result Date: 01/29/2023 CLINICAL DATA:  History of pneumonia. Persistent shortness of breath EXAM: CHEST - 2 VIEW COMPARISON:  CXR 12/29/22, CTA Chest 01/04/23 FINDINGS: No pleural effusion. No pneumothorax. Unchanged cardiac and mediastinal contours. Markedly improved aeration of the bilateral lungs with persistent prominent interstitial opacities in the right mid and lower lung, as well as the left lung base. No radiographically apparent displaced rib fractures. Visualized upper abdomen is unremarkable. IMPRESSION: Improved aeration of the bilateral lungs with persistent prominent interstitial opacities in the right mid and lower lung, as well as the left lung base. Findings could represent resolving pneumonia or scarring. Electronically Signed   By: Lorenza Cambridge M.D.   On: 01/29/2023 14:31   CT Angio Chest Pulmonary Embolism (PE) W or WO Contrast  Result Date: 01/04/2023 CLINICAL DATA:  Shortness of breath EXAM: CT ANGIOGRAPHY CHEST WITH CONTRAST TECHNIQUE: Multidetector CT imaging of the chest  was performed using the standard protocol during bolus administration of intravenous contrast. Multiplanar CT image reconstructions and MIPs were obtained to evaluate the vascular anatomy. RADIATION DOSE REDUCTION: This exam was performed according to the departmental dose-optimization program which includes automated exposure control, adjustment of the mA and/or kV according to patient size and/or use of iterative reconstruction technique. CONTRAST:  75mL OMNIPAQUE IOHEXOL 350 MG/ML SOLN COMPARISON:  CT angiogram chest 04/07/2021 FINDINGS: Cardiovascular: Satisfactory opacification of the pulmonary arteries to the segmental level. No evidence of pulmonary embolism. Normal heart size. No pericardial effusion. Mediastinum/Nodes: There is an enlarged subcarinal lymph node measuring 14 mm. There are prominent prevascular, AP window, precarinal and paratracheal lymph nodes as well. Mildly enlarged right hilar lymph nodes measure up to 1 cm. Visualized esophagus and thyroid gland are within normal limits. Lungs/Pleura: There are trace bilateral pleural effusions. Mild emphysematous changes are present. There is scarring in the right lung apex. Multifocal airspace and ground-glass opacities are seen within the bilateral lower lobes and minimally within the lingula and right middle lobe. There is dense airspace consolidation in both lung bases with bilateral lower lobe peribronchial thickening and plugging of bronchi. Trachea patent. No pneumothorax. Upper Abdomen: No acute abnormality. Musculoskeletal: No chest wall abnormality. There are trace compression deformities of the superior endplates of T5 and T6, new from prior. Review of the MIP images confirms the above findings. IMPRESSION: 1. No evidence for pulmonary embolism. 2. Multifocal airspace and ground-glass opacities in the bilateral lower lobes and minimally within the right middle lobe and lingula worrisome for multifocal pneumonia. 3. Bilateral lower lobe  peribronchial thickening and plugging of bronchi worrisome for bronchitis. 4. Trace bilateral pleural effusions. 5. Mediastinal and right hilar lymphadenopathy, likely reactive. 6. New trace compression fractures superior endplates of T5 and T6. Correlate clinically for acuity. Electronically Signed   By: Darliss Cheney M.D.   On: 01/04/2023 15:49     Assessment & Plan:   Compression fracture of thoracic vertebra with routine healing - Ambulatory referral to Orthopedics  2. Acute respiratory failure with hypoxia  - DG Chest 2 View  3. Sepsis due to pneumonia  - DG Chest 2 View - CBC - Comprehensive metabolic panel - Magnesium - Ambulatory referral to Pulmonology  4. Smoker  - Ambulatory Referral Lung Cancer Screening [place] [facility]  5. Physical deconditioning  - Ambulatory referral to Physical Therapy  6. Memory loss  - Ambulatory referral to Speech  Therapy  7. Encounter for screening mammogram for malignant neoplasm of breast  - MM Digital Screening  8. History of rectal cancer  - Ambulatory referral to Gastroenterology  Follow up:  Follow up in 6 months     Ivonne Andrew, NP 02/02/2023

## 2023-01-29 NOTE — Patient Instructions (Signed)
1. Compression fracture of T5 vertebra with routine healing, subsequent encounter  - Ambulatory referral to Orthopedics  2. Acute respiratory failure with hypoxia  - DG Chest 2 View  3. Sepsis due to pneumonia  - DG Chest 2 View - CBC - Comprehensive metabolic panel - Magnesium - Ambulatory referral to Pulmonology  4. Smoker  - Ambulatory Referral Lung Cancer Screening [place] [facility]  5. Physical deconditioning  - Ambulatory referral to Physical Therapy  6. Memory loss  - Ambulatory referral to Speech Therapy  7. Encounter for screening mammogram for malignant neoplasm of breast  - MM Digital Screening  8. History of rectal cancer  - Ambulatory referral to Gastroenterology  Follow up:  Follow up in 6 months

## 2023-01-30 LAB — COMPREHENSIVE METABOLIC PANEL
ALT: 16 IU/L (ref 0–32)
AST: 18 IU/L (ref 0–40)
Albumin/Globulin Ratio: 1 — ABNORMAL LOW (ref 1.2–2.2)
Albumin: 3.8 g/dL (ref 3.8–4.9)
Alkaline Phosphatase: 102 IU/L (ref 44–121)
BUN/Creatinine Ratio: 9 (ref 9–23)
BUN: 12 mg/dL (ref 6–24)
Bilirubin Total: <0.2 mg/dL (ref 0.0–1.2)
CO2: 22 mmol/L (ref 20–29)
Calcium: 10.1 mg/dL (ref 8.7–10.2)
Chloride: 101 mmol/L (ref 96–106)
Creatinine, Ser: 1.37 mg/dL — ABNORMAL HIGH (ref 0.57–1.00)
Globulin, Total: 3.7 g/dL (ref 1.5–4.5)
Glucose: 91 mg/dL (ref 70–99)
Potassium: 4.6 mmol/L (ref 3.5–5.2)
Sodium: 139 mmol/L (ref 134–144)
Total Protein: 7.5 g/dL (ref 6.0–8.5)
eGFR: 45 mL/min/{1.73_m2} — ABNORMAL LOW (ref >59)

## 2023-01-30 LAB — CBC
Hematocrit: 27.3 % — ABNORMAL LOW (ref 34.0–46.6)
Hemoglobin: 9.2 g/dL — ABNORMAL LOW (ref 11.1–15.9)
MCH: 31.8 pg (ref 26.6–33.0)
MCHC: 33.7 g/dL (ref 31.5–35.7)
MCV: 95 fL (ref 79–97)
Platelets: 334 10*3/uL (ref 150–450)
RBC: 2.89 x10E6/uL — ABNORMAL LOW (ref 3.77–5.28)
RDW: 14 % (ref 11.7–15.4)
WBC: 7.2 10*3/uL (ref 3.4–10.8)

## 2023-01-30 LAB — MAGNESIUM: Magnesium: 1.5 mg/dL — ABNORMAL LOW (ref 1.6–2.3)

## 2023-02-02 ENCOUNTER — Encounter: Payer: Self-pay | Admitting: Nurse Practitioner

## 2023-02-02 DIAGNOSIS — S22000D Wedge compression fracture of unspecified thoracic vertebra, subsequent encounter for fracture with routine healing: Secondary | ICD-10-CM | POA: Insufficient documentation

## 2023-02-02 NOTE — Assessment & Plan Note (Signed)
-   Ambulatory referral to Orthopedics  2. Acute respiratory failure with hypoxia  - DG Chest 2 View  3. Sepsis due to pneumonia  - DG Chest 2 View - CBC - Comprehensive metabolic panel - Magnesium - Ambulatory referral to Pulmonology  4. Smoker  - Ambulatory Referral Lung Cancer Screening [place] [facility]  5. Physical deconditioning  - Ambulatory referral to Physical Therapy  6. Memory loss  - Ambulatory referral to Speech Therapy  7. Encounter for screening mammogram for malignant neoplasm of breast  - MM Digital Screening  8. History of rectal cancer  - Ambulatory referral to Gastroenterology  Follow up:  Follow up in 6 months

## 2023-02-05 ENCOUNTER — Ambulatory Visit (HOSPITAL_BASED_OUTPATIENT_CLINIC_OR_DEPARTMENT_OTHER)
Admission: RE | Admit: 2023-02-05 | Discharge: 2023-02-05 | Disposition: A | Discharge status: Home / Self Care | Payer: Medicaid - Out of State | Source: Ambulatory Visit | Attending: Nurse Practitioner | Admitting: Nurse Practitioner

## 2023-02-05 DIAGNOSIS — Z1231 Encounter for screening mammogram for malignant neoplasm of breast: Secondary | ICD-10-CM | POA: Diagnosis present

## 2023-02-12 ENCOUNTER — Ambulatory Visit: Payer: No Typology Code available for payment source | Admitting: Speech Pathology

## 2023-02-12 ENCOUNTER — Ambulatory Visit: Payer: No Typology Code available for payment source | Attending: Nurse Practitioner | Admitting: Physical Therapy

## 2023-02-12 NOTE — Therapy (Deleted)
OUTPATIENT SPEECH LANGUAGE PATHOLOGY EVALUATION   Patient Name: Bonnie Douglas MRN: [id number] DOB:02/22/1966, 57 y.o., female Today's Date: 02/12/2023  PCP: Ivonne Andrew, NP  REFERRING PROVIDER: Ivonne Andrew, NP   END OF SESSION:   Past Medical History:  Diagnosis Date   Anal cancer    Arthritis    Depression    Fibromyalgia    Heroin abuse    Hypertension    Tobacco abuse 09/22/2012   Past Surgical History:  Procedure Laterality Date   LEFT HEART CATHETERIZATION WITH CORONARY ANGIOGRAM N/A 09/22/2012   Procedure: LEFT HEART CATHETERIZATION WITH CORONARY ANGIOGRAM;  Surgeon: Lennette Bihari, MD;  Location: [facility] CATH LAB;  Service: Cardiovascular;  Laterality: N/A;   uterine ablation     Patient Active Problem List   Diagnosis Date Noted   Compression fracture of thoracic vertebra with routine healing 02/02/2023   Sepsis due to pneumonia 12/28/2022   Acute respiratory failure with hypoxia 12/28/2022   AKI (acute kidney injury) 12/28/2022   Influenza A 12/28/2022   Hypokalemia 12/28/2022   Hypocalcemia 12/28/2022   Nausea, vomiting, and diarrhea 12/28/2022   History of substance abuse 12/28/2022   Thrombocytopenia 12/28/2022   PMDD (premenstrual dysphoric disorder) 09/01/2013   Cannabis abuse 09/01/2013   MDD (major depressive disorder), recurrent, severe, 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  Rationale for Evaluation and Treatment: Rehabilitation  SUBJECTIVE:   SUBJECTIVE STATEMENT: *** Pt accompanied by: {accompnied:27141}  PERTINENT HISTORY: 57 year old female with past medical history of, previous heroin use now on methadone, recent visit to the ED for influenza, presented with nausea, vomiting, diarrhea, myalgias,  cough.  Hypoxic in the emergency department.  Admitted for acute hypoxic respiratory failure, influenza A, AKI, sepsis secondary to pneumonia. Started on IVF and tamiflu and doxycycline.  Developed ARDS type picture and was transferred to the critical care service for worsening respiratory distress.  Subsequently, patient was transferred out of the ICU.   PAIN:  Are you having pain? {OPRCPAIN:27236}  FALLS: Has patient fallen in last 6 months?  {JXBJYNWG:95621}  LIVING ENVIRONMENT: Lives with: {OPRC lives with:25569::"lives with their family"} Lives in: {Lives in:25570}  PLOF:  Level of assistance: {HYQMVHQ:46962} Employment: {SLPemployment:25674}  PATIENT GOALS: ***  OBJECTIVE:   DIAGNOSTIC FINDINGS: ***  COGNITION: Overall cognitive status: Impaired Areas of impairment:  {cognitiveimpairmentslp:27409} Functional deficits: ***  COGNITIVE COMMUNICATION: Following directions: {commands:24018}  Auditory comprehension: {WFL-Impaired:25365} Verbal expression: {WFL-Impaired:25365} Functional communication: {WFL-Impaired:25365}  ORAL MOTOR EXAMINATION: Overall status: {OMESLP2:27645} Comments: ***  STANDARDIZED ASSESSMENTS: {SLPstandardizedassessment:27092}  PATIENT REPORTED OUTCOME MEASURES (PROM): {SLPPROM:27095}   TODAY'S TREATMENT:  DATE:   02-12-23:   PATIENT EDUCATION: Education details: See above Person educated: {Person educated:25204} Education method: {Education Method:25205} Education comprehension: {Education Comprehension:25206}   GOALS: Goals reviewed with patient? Yes  SHORT TERM GOALS: Target date: 03/12/2023    *** Baseline: Goal status: {GOALSTATUS:25110}  2.  *** Baseline:  Goal status: {GOALSTATUS:25110}  3.  *** Baseline:  Goal status: {GOALSTATUS:25110}  4.  *** Baseline:  Goal status:  {GOALSTATUS:25110}  5.  *** Baseline:  Goal status: {GOALSTATUS:25110}  6.  *** Baseline:  Goal status: {GOALSTATUS:25110}  LONG TERM GOALS: Target date: ***  *** Baseline:  Goal status: {GOALSTATUS:25110}  2.  *** Baseline:  Goal status: {GOALSTATUS:25110}  3.  *** Baseline:  Goal status: {GOALSTATUS:25110}  4.  *** Baseline:  Goal status: {GOALSTATUS:25110}  5.  *** Baseline:  Goal status: {GOALSTATUS:25110}  6.  *** Baseline:  Goal status: {GOALSTATUS:25110}  ASSESSMENT:  CLINICAL IMPRESSION: Patient is a 57 y.o. female who was seen today for cognitive-linguistic evaluation.   OBJECTIVE IMPAIRMENTS: include {SLPOBJIMP:27107}. These impairments are limiting patient from {SLPLIMIT:27108}. Factors affecting potential to achieve goals and functional outcome are {SLP factors:25450}.. Patient will benefit from skilled SLP services to address above impairments and improve overall function.  REHAB POTENTIAL: {rehabpotential:25112}  PLAN:  SLP FREQUENCY: {rehab frequency:25116}  SLP DURATION: {rehab duration:25117}  PLANNED INTERVENTIONS: {SLP treatment/interventions:25449}    Cephus Shelling, Student-SLP 02/12/2023, 7:47 AM

## 2023-05-15 ENCOUNTER — Encounter: Payer: Self-pay | Admitting: *Deleted

## 2023-07-31 ENCOUNTER — Ambulatory Visit: Payer: Self-pay | Admitting: Nurse Practitioner

## 2024-06-26 ENCOUNTER — Encounter (HOSPITAL_BASED_OUTPATIENT_CLINIC_OR_DEPARTMENT_OTHER): Payer: Self-pay

## 2024-06-26 ENCOUNTER — Other Ambulatory Visit: Payer: Self-pay

## 2024-06-26 ENCOUNTER — Emergency Department (HOSPITAL_BASED_OUTPATIENT_CLINIC_OR_DEPARTMENT_OTHER): Payer: Self-pay

## 2024-06-26 ENCOUNTER — Emergency Department (HOSPITAL_BASED_OUTPATIENT_CLINIC_OR_DEPARTMENT_OTHER)
Admission: EM | Admit: 2024-06-26 | Discharge: 2024-06-26 | Disposition: A | Discharge status: Home / Self Care | Payer: Self-pay | Attending: Emergency Medicine | Admitting: Emergency Medicine

## 2024-06-26 DIAGNOSIS — K8020 Calculus of gallbladder without cholecystitis without obstruction: Secondary | ICD-10-CM | POA: Insufficient documentation

## 2024-06-26 DIAGNOSIS — Z91040 Latex allergy status: Secondary | ICD-10-CM | POA: Insufficient documentation

## 2024-06-26 DIAGNOSIS — R112 Nausea with vomiting, unspecified: Secondary | ICD-10-CM

## 2024-06-26 LAB — COMPREHENSIVE METABOLIC PANEL WITH GFR
ALT: 17 U/L (ref 0–44)
AST: 25 U/L (ref 15–41)
Albumin: 3.6 g/dL (ref 3.5–5.0)
Alkaline Phosphatase: 111 U/L (ref 38–126)
Anion gap: 12 (ref 5–15)
BUN: 23 mg/dL — ABNORMAL HIGH (ref 6–20)
CO2: 26 mmol/L (ref 22–32)
Calcium: 9.5 mg/dL (ref 8.9–10.3)
Chloride: 102 mmol/L (ref 98–111)
Creatinine, Ser: 1.39 mg/dL — ABNORMAL HIGH (ref 0.44–1.00)
GFR, Estimated: 43 mL/min — ABNORMAL LOW (ref >60)
Glucose, Bld: 91 mg/dL (ref 70–99)
Potassium: 3.5 mmol/L (ref 3.5–5.1)
Sodium: 140 mmol/L (ref 135–145)
Total Bilirubin: <0.2 mg/dL (ref 0.0–1.2)
Total Protein: 6.5 g/dL (ref 6.5–8.1)

## 2024-06-26 LAB — URINE DRUG SCREEN
Amphetamines: POSITIVE — AB
Barbiturates: NEGATIVE
Benzodiazepines: NEGATIVE
Cocaine: NEGATIVE
Fentanyl: NEGATIVE
Methadone Scn, Ur: POSITIVE — AB
Opiates: NEGATIVE
Tetrahydrocannabinol: NEGATIVE

## 2024-06-26 LAB — CBC
HCT: 38.1 % (ref 36.0–46.0)
Hemoglobin: 12.6 g/dL (ref 12.0–15.0)
MCH: 30.9 pg (ref 26.0–34.0)
MCHC: 33.1 g/dL (ref 30.0–36.0)
MCV: 93.4 fL (ref 80.0–100.0)
Platelets: 187 K/uL (ref 150–400)
RBC: 4.08 MIL/uL (ref 3.87–5.11)
RDW: 13.8 % (ref 11.5–15.5)
WBC: 10.9 K/uL — ABNORMAL HIGH (ref 4.0–10.5)
nRBC: 0 % (ref 0.0–0.2)

## 2024-06-26 LAB — URINALYSIS, ROUTINE W REFLEX MICROSCOPIC
Bacteria, UA: NONE SEEN
Glucose, UA: NEGATIVE mg/dL
Hgb urine dipstick: NEGATIVE
Ketones, ur: NEGATIVE mg/dL
Nitrite: NEGATIVE
Protein, ur: 30 mg/dL — AB
Specific Gravity, Urine: 1.033 — ABNORMAL HIGH (ref 1.005–1.030)
pH: 5.5 (ref 5.0–8.0)

## 2024-06-26 LAB — ETHANOL: Alcohol, Ethyl (B): <15 mg/dL (ref <15)

## 2024-06-26 LAB — LIPASE, BLOOD: Lipase: 13 U/L (ref 11–51)

## 2024-06-26 MED ORDER — ONDANSETRON HCL 4 MG/2ML IJ SOLN
4.0000 mg | Freq: Once | INTRAMUSCULAR | Status: AC | PRN
  Administered 2024-06-26: 4 mg via INTRAVENOUS
  Filled 2024-06-26: qty 2

## 2024-06-26 MED ORDER — IOHEXOL 300 MG/ML  SOLN
100.0000 mL | Freq: Once | INTRAMUSCULAR | Status: AC | PRN
  Administered 2024-06-26: 100 mL via INTRAVENOUS

## 2024-06-26 MED ORDER — METHADONE HCL 5 MG PO TABS
125.0000 mg | ORAL_TABLET | ORAL | Status: AC
  Administered 2024-06-26: 125 mg via ORAL
  Filled 2024-06-26 (×2): qty 1

## 2024-06-26 MED ORDER — ONDANSETRON 4 MG PO TBDP: 4.0000 mg | ORAL_TABLET | Freq: Three times a day (TID) | ORAL | 0 refills | Status: AC | PRN

## 2024-06-26 NOTE — Discharge Instructions (Signed)
 You were seen for your nausea and vomiting in the emergency department.  You were found to have a gallstone in your gallbladder but no signs of infection.  At home, please take the Zofran  for any nausea that you have.    Follow-up with your primary doctor in 2-3 days regarding your visit.  Follow-up with [facility] surgery about a formal ultrasound and seeing if you need to have your gallbladder removed.  Return immediately to the emergency department if you experience any of the following: Fevers, severe abdominal pain, vomiting despite the medication, or any other concerning symptoms.    Thank you for visiting our Emergency Department. It was a pleasure taking care of you today.

## 2024-06-26 NOTE — ED Triage Notes (Signed)
 Pt reports abd pain w/ N/V starting this AM.

## 2024-06-26 NOTE — ED Provider Notes (Signed)
  EMERGENCY DEPARTMENT AT [facility] Provider Note   CSN: [id number] Arrival date & time: 06/26/24  9267     Patient presents with: Abdominal Pain   Bonnie ENNEN is a 58 y.o. female.  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  Patient reports that she was at work when she started experiencing abdominal cramping in the middle of her abdomen then started having several episodes of emesis and diarrhea. Received zofran  prior to my arrival and reports the nausea and pain have resolved. Has hx of rectal cancer treated with radiation and chemo that is in remission. Says she is worried about possible cancer. No hx of abdominal surgeries. Has been compliant with her methadone .        Prior to Admission medications   Medication Sig Start Date End Date Taking? Authorizing Provider  albuterol  (VENTOLIN  HFA) 108 (90 Base) MCG/ACT inhaler Inhale 2 puffs into the lungs every 6 (six) hours as needed for wheezing or shortness of breath. Patient not taking: Reported on 01/29/2023 01/08/23   Pokhrel, Laxman, MD  amLODipine  (NORVASC ) 5 MG tablet Take 2 tablets (10 mg total) by mouth daily. 12/25/22   Dean Clarity, MD  benzonatate  (TESSALON ) 100 MG capsule Take 1 capsule (100 mg total) by mouth every 8 (eight) hours. Patient not taking: Reported on 01/29/2023 01/05/23   Pokhrel, Laxman, MD  gabapentin (NEURONTIN) 300 MG capsule Take 600 mg by mouth 3 (three) times daily. Patient not taking: Reported on 01/29/2023 11/03/22   [provider]  ibuprofen  (ADVIL ) 600 MG tablet Take 1 tablet (600 mg total) by mouth every 6 (six) hours as needed. Patient not taking: Reported on 01/29/2023 12/25/22   Dean Clarity, MD  methocarbamol  (ROBAXIN ) 500 MG tablet Take 1 tablet (500 mg total) by mouth 2 (two)  times daily. Patient not taking: Reported on 01/29/2023 12/25/22   Dean Clarity, MD  metoprolol  succinate (TOPROL -XL) 25 MG 24 hr tablet Take 1 tablet (25 mg total) by mouth daily. 01/05/23   Pokhrel, Laxman, MD  nicotine  (NICODERM CQ  - DOSED IN MG/24 HOURS) 21 mg/24hr patch Place 1 patch (21 mg total) onto the skin daily. Patient not taking: Reported on 01/29/2023 01/08/23   Sonjia Held, MD  ondansetron  (ZOFRAN -ODT) 4 MG disintegrating tablet Take 1 tablet (4 mg total) by mouth every 8 (eight) hours as needed for nausea or vomiting. Patient not taking: Reported on 01/29/2023 12/25/22   Haviland, Julie, MD    Allergies: Biaxin [clarithromycin], Vicodin [hydrocodone-acetaminophen ], and Latex    Review of Systems  Updated Vital Signs BP (!) 140/97   Pulse 92   Temp 98.1 F (36.7 C) (Oral)   Resp (!) 28   Ht 5' 9 (1.753 m)   Wt 90.7 kg   SpO2 95%   BMI 29.53 kg/m   Physical Exam Vitals and nursing note reviewed.  Constitutional:      General: She is not in acute distress.    Appearance: She is well-developed.  HENT:     Head: Normocephalic and atraumatic.     Right Ear: External ear normal.     Left Ear: External ear normal.     Nose: Nose normal.  Eyes:     Extraocular Movements: Extraocular movements intact.     Conjunctiva/sclera: Conjunctivae normal.     Pupils: Pupils are equal, round, and  reactive to light.  Abdominal:     General: Abdomen is flat. There is no distension.     Palpations: Abdomen is soft. There is no mass.     Tenderness: There is no abdominal tenderness. There is no guarding.     Comments: Negative Murphy sign  Musculoskeletal:     Cervical back: Normal range of motion and neck supple.  Neurological:     Mental Status: She is alert and oriented to person, place, and time. Mental status is at baseline.  Psychiatric:        Mood and Affect: Mood normal.     (all labs ordered are listed, but only abnormal results are displayed) Labs Reviewed  CBC  - Abnormal; Notable for the following components:      Result Value   WBC 10.9 (*)    All other components within normal limits  LIPASE, BLOOD  COMPREHENSIVE METABOLIC PANEL WITH GFR  URINALYSIS, ROUTINE W REFLEX MICROSCOPIC  URINE DRUG SCREEN  ETHANOL    EKG: None  Radiology: No results found.  {Document cardiac monitor, telemetry assessment procedure when appropriate:32947} Procedures   EMERGENCY DEPARTMENT BILIARY ULTRASOUND INTERPRETATION Study: Limited Abdominal Ultrasound of the Gallbladder and Common Bile Duct.  INDICATIONS: Nausea and vomiting Indication: Multiple views of the gallbladder and common bile duct were obtained in real-time with a Multi-frequency probe.  PERFORMED BY:  Myself IMAGES ARCHIVED?: Yes LIMITATIONS: Body habitus INTERPRETATION: Cholelithiasis, Gallbladder wall normal in thickness, and Sonographic Murphy's sign abscent   Medications Ordered in the ED  ondansetron  (ZOFRAN ) injection 4 mg (4 mg Intravenous Given 06/26/24 0745)    Clinical Course as of 06/26/24 0830  Sun Jun 26, 2024  0829 Verified methadone  dose of 125 mg with alcohol and drug services on [employer]. [RP]    Clinical Course User Index [RP] Yolande Lamar BROCKS, MD   {Click here for ABCD2, HEART and other calculators REFRESH Note before signing:1}                              Medical Decision Making Amount and/or Complexity of Data Reviewed Labs: ordered. Radiology: ordered.  Risk Prescription drug management.   ***  {Document critical care time when appropriate  Document review of labs and clinical decision tools ie CHADS2VASC2, etc  Document your independent review of radiology images and any outside records  Document your discussion with family members, caretakers and with consultants  Document social determinants of health affecting pt's care  Document your decision making why or why not admission, treatments were needed:32947:::1}   Final diagnoses:   None    ED Discharge Orders     None

## 2024-06-28 ENCOUNTER — Telehealth: Payer: Self-pay

## 2024-06-28 NOTE — Transitions of Care (Post Inpatient/ED Visit) (Signed)
   06/28/2024  Name: Bonnie Douglas MRN: [id number] DOB: [birth year]  Today's TOC FU Call Status: Today's TOC FU Call Status:: Unsuccessful Call (1st Attempt) Unsuccessful Call (1st Attempt) Date: 06/28/24  Attempted to reach the patient regarding the most recent Inpatient/ED visit.  Follow Up Plan: Additional outreach attempts will be made to reach the patient to complete the Transitions of Care (Post Inpatient/ED visit) call.   Signature  [employer], [place]

## 2024-07-27 ENCOUNTER — Emergency Department (HOSPITAL_BASED_OUTPATIENT_CLINIC_OR_DEPARTMENT_OTHER): Payer: Self-pay

## 2024-07-27 ENCOUNTER — Emergency Department (HOSPITAL_BASED_OUTPATIENT_CLINIC_OR_DEPARTMENT_OTHER)
Admission: EM | Admit: 2024-07-27 | Discharge: 2024-07-27 | Disposition: A | Discharge status: Home / Self Care | Payer: Self-pay | Attending: Emergency Medicine | Admitting: Emergency Medicine

## 2024-07-27 ENCOUNTER — Encounter (HOSPITAL_BASED_OUTPATIENT_CLINIC_OR_DEPARTMENT_OTHER): Payer: Self-pay | Admitting: Emergency Medicine

## 2024-07-27 ENCOUNTER — Other Ambulatory Visit: Payer: Self-pay

## 2024-07-27 DIAGNOSIS — Z91040 Latex allergy status: Secondary | ICD-10-CM | POA: Insufficient documentation

## 2024-07-27 DIAGNOSIS — K5730 Diverticulosis of large intestine without perforation or abscess without bleeding: Secondary | ICD-10-CM | POA: Insufficient documentation

## 2024-07-27 DIAGNOSIS — R7989 Other specified abnormal findings of blood chemistry: Secondary | ICD-10-CM | POA: Insufficient documentation

## 2024-07-27 DIAGNOSIS — K5732 Diverticulitis of large intestine without perforation or abscess without bleeding: Secondary | ICD-10-CM

## 2024-07-27 DIAGNOSIS — Z85038 Personal history of other malignant neoplasm of large intestine: Secondary | ICD-10-CM | POA: Insufficient documentation

## 2024-07-27 LAB — URINALYSIS, ROUTINE W REFLEX MICROSCOPIC
Bacteria, UA: NONE SEEN
Glucose, UA: NEGATIVE mg/dL
Hgb urine dipstick: NEGATIVE
Nitrite: NEGATIVE
Specific Gravity, Urine: 1.026 (ref 1.005–1.030)
pH: 6 (ref 5.0–8.0)

## 2024-07-27 LAB — CBC WITH DIFFERENTIAL/PLATELET
Abs Immature Granulocytes: 0.02 K/uL (ref 0.00–0.07)
Basophils Absolute: 0 K/uL (ref 0.0–0.1)
Basophils Relative: 0 %
Eosinophils Absolute: 0.4 K/uL (ref 0.0–0.5)
Eosinophils Relative: 5 %
HCT: 38.2 % (ref 36.0–46.0)
Hemoglobin: 12.3 g/dL (ref 12.0–15.0)
Immature Granulocytes: 0 %
Lymphocytes Relative: 28 %
Lymphs Abs: 2 K/uL (ref 0.7–4.0)
MCH: 29.4 pg (ref 26.0–34.0)
MCHC: 32.2 g/dL (ref 30.0–36.0)
MCV: 91.2 fL (ref 80.0–100.0)
Monocytes Absolute: 0.5 K/uL (ref 0.1–1.0)
Monocytes Relative: 7 %
Neutro Abs: 4.1 K/uL (ref 1.7–7.7)
Neutrophils Relative %: 60 %
Platelets: 205 K/uL (ref 150–400)
RBC: 4.19 MIL/uL (ref 3.87–5.11)
RDW: 13.9 % (ref 11.5–15.5)
WBC: 7 K/uL (ref 4.0–10.5)
nRBC: 0 % (ref 0.0–0.2)

## 2024-07-27 LAB — COMPREHENSIVE METABOLIC PANEL WITH GFR
ALT: 15 U/L (ref 0–44)
AST: 23 U/L (ref 15–41)
Albumin: 4.1 g/dL (ref 3.5–5.0)
Alkaline Phosphatase: 107 U/L (ref 38–126)
Anion gap: 14 (ref 5–15)
BUN: 16 mg/dL (ref 6–20)
CO2: 26 mmol/L (ref 22–32)
Calcium: 10.6 mg/dL — ABNORMAL HIGH (ref 8.9–10.3)
Chloride: 99 mmol/L (ref 98–111)
Creatinine, Ser: 1.59 mg/dL — ABNORMAL HIGH (ref 0.44–1.00)
GFR, Estimated: 37 mL/min — ABNORMAL LOW (ref >60)
Glucose, Bld: 85 mg/dL (ref 70–99)
Potassium: 4 mmol/L (ref 3.5–5.1)
Sodium: 139 mmol/L (ref 135–145)
Total Bilirubin: 0.3 mg/dL (ref 0.0–1.2)
Total Protein: 8 g/dL (ref 6.5–8.1)

## 2024-07-27 LAB — LIPASE, BLOOD: Lipase: 11 U/L (ref 11–51)

## 2024-07-27 MED ORDER — MORPHINE SULFATE (PF) 4 MG/ML IV SOLN
4.0000 mg | Freq: Once | INTRAVENOUS | Status: AC
  Administered 2024-07-27: 4 mg via INTRAVENOUS
  Filled 2024-07-27: qty 1

## 2024-07-27 MED ORDER — CIPROFLOXACIN HCL 500 MG PO TABS: 500.0000 mg | ORAL_TABLET | Freq: Two times a day (BID) | ORAL | 0 refills | Status: AC

## 2024-07-27 MED ORDER — SODIUM CHLORIDE 0.9 % IV BOLUS
1000.0000 mL | Freq: Once | INTRAVENOUS | Status: AC
  Administered 2024-07-27: 1000 mL via INTRAVENOUS

## 2024-07-27 MED ORDER — METRONIDAZOLE 500 MG PO TABS: 500.0000 mg | ORAL_TABLET | Freq: Three times a day (TID) | ORAL | 0 refills | Status: AC

## 2024-07-27 MED ORDER — IOHEXOL 300 MG/ML  SOLN
80.0000 mL | Freq: Once | INTRAMUSCULAR | Status: AC | PRN
  Administered 2024-07-27: 80 mL via INTRAVENOUS

## 2024-07-27 MED ORDER — METRONIDAZOLE 500 MG PO TABS
500.0000 mg | ORAL_TABLET | Freq: Once | ORAL | Status: AC
  Administered 2024-07-27: 500 mg via ORAL
  Filled 2024-07-27: qty 1

## 2024-07-27 MED ORDER — ONDANSETRON HCL 4 MG/2ML IJ SOLN
4.0000 mg | Freq: Once | INTRAMUSCULAR | Status: AC
  Administered 2024-07-27: 4 mg via INTRAVENOUS
  Filled 2024-07-27: qty 2

## 2024-07-27 MED ORDER — FENTANYL CITRATE PF 50 MCG/ML IJ SOSY
25.0000 ug | PREFILLED_SYRINGE | Freq: Once | INTRAMUSCULAR | Status: DC
Start: 2024-07-27 — End: 2024-07-28
  Filled 2024-07-27: qty 1

## 2024-07-27 MED ORDER — CIPROFLOXACIN HCL 500 MG PO TABS
500.0000 mg | ORAL_TABLET | Freq: Once | ORAL | Status: AC
  Administered 2024-07-27: 500 mg via ORAL
  Filled 2024-07-27: qty 1

## 2024-07-27 MED ORDER — METHOCARBAMOL 500 MG PO TABS: 500.0000 mg | ORAL_TABLET | Freq: Two times a day (BID) | ORAL | 0 refills | Status: AC

## 2024-07-27 NOTE — ED Triage Notes (Signed)
 C/o lower abd cramping and n/v x 6 months. Denies fevers. States pain worse today.

## 2024-07-27 NOTE — ED Notes (Addendum)
 DC paperwork given and verbally understood.... Pt AO x4 upon DC.[email or web address]

## 2024-07-27 NOTE — ED Provider Notes (Signed)
 [place] EMERGENCY DEPARTMENT AT [facility] Provider Note   CSN: [id number] Arrival date & time: 07/27/24  1639     Patient presents with: Abdominal Pain   Bonnie Douglas is a 58 y.o. female.  Patient with past history significant for colorectal cancer presents to the emergency department with concerns of abdominal pain and possible constipation.  States that she has had lower abdominal pain for the last 6 months or so with some nausea and vomiting and states that she has had more notable left lower quadrant pain in the last several days.  No reported fever, chills, or bodyaches.  Denies any diarrhea.  Does state that she had a bowel movement with some blood in it about a week ago.  She does feel that she is having some increased straining.  She is unsure of when her last bowel movement was.  Does not currently take any laxative medications.   Abdominal Pain      Prior to Admission medications   Medication Sig Start Date End Date Taking? Authorizing Provider  ciprofloxacin (CIPRO) 500 MG tablet Take 1 tablet (500 mg total) by mouth every 12 (twelve) hours for 5 days. 07/27/24 08/01/24 Yes Zelia Yzaguirre A, PA-C  methocarbamol  (ROBAXIN ) 500 MG tablet Take 1 tablet (500 mg total) by mouth 2 (two) times daily. 07/27/24  Yes Damont Balles A, PA-C  metroNIDAZOLE (FLAGYL) 500 MG tablet Take 1 tablet (500 mg total) by mouth 3 (three) times daily for 5 days. 07/27/24 08/01/24 Yes Kynzie Polgar A, PA-C  albuterol  (VENTOLIN  HFA) 108 (90 Base) MCG/ACT inhaler Inhale 2 puffs into the lungs every 6 (six) hours as needed for wheezing or shortness of breath. Patient not taking: Reported on 01/29/2023 01/08/23   Pokhrel, Laxman, MD  amLODipine  (NORVASC ) 5 MG tablet Take 2 tablets (10 mg total) by mouth daily. 12/25/22   Dean Clarity, MD  benzonatate  (TESSALON ) 100 MG capsule Take 1 capsule (100 mg total) by mouth every 8 (eight) hours. Patient not taking: Reported on 01/29/2023 01/05/23   Pokhrel,  Laxman, MD  gabapentin (NEURONTIN) 300 MG capsule Take 600 mg by mouth 3 (three) times daily. Patient not taking: Reported on 01/29/2023 11/03/22   [provider]  ibuprofen  (ADVIL ) 600 MG tablet Take 1 tablet (600 mg total) by mouth every 6 (six) hours as needed. Patient not taking: Reported on 01/29/2023 12/25/22   Haviland, Julie, MD  metoprolol  succinate (TOPROL -XL) 25 MG 24 hr tablet Take 1 tablet (25 mg total) by mouth daily. 01/05/23   Pokhrel, Laxman, MD  nicotine  (NICODERM CQ  - DOSED IN MG/24 HOURS) 21 mg/24hr patch Place 1 patch (21 mg total) onto the skin daily. Patient not taking: Reported on 01/29/2023 01/08/23   Sonjia Held, MD  ondansetron  (ZOFRAN -ODT) 4 MG disintegrating tablet Take 1 tablet (4 mg total) by mouth every 8 (eight) hours as needed for nausea or vomiting. 06/26/24   Yolande Lamar BROCKS, MD    Allergies: Biaxin [clarithromycin], Vicodin [hydrocodone-acetaminophen ], and Latex    Review of Systems  Gastrointestinal:  Positive for abdominal pain.  All other systems reviewed and are negative.   Updated Vital Signs BP (!) 140/91 (BP Location: Right Arm)   Pulse 68   Temp 97.9 F (36.6 C)   Resp 16   SpO2 99%   Physical Exam Vitals and nursing note reviewed.  Constitutional:      General: She is not in acute distress.    Appearance: She is well-developed.  HENT:  Head: Normocephalic and atraumatic.  Eyes:     Conjunctiva/sclera: Conjunctivae normal.  Cardiovascular:     Rate and Rhythm: Normal rate and regular rhythm.     Heart sounds: No murmur heard. Pulmonary:     Effort: Pulmonary effort is normal. No respiratory distress.     Breath sounds: Normal breath sounds.  Abdominal:     Palpations: Abdomen is soft.     Tenderness: There is abdominal tenderness in the left lower quadrant. There is guarding and rebound.     Hernia: No hernia is present.  Musculoskeletal:        General: No swelling.     Cervical back: Neck supple.  Skin:     General: Skin is warm and dry.     Capillary Refill: Capillary refill takes less than 2 seconds.  Neurological:     Mental Status: She is alert.  Psychiatric:        Mood and Affect: Mood normal.     (all labs ordered are listed, but only abnormal results are displayed) Labs Reviewed  COMPREHENSIVE METABOLIC PANEL WITH GFR - Abnormal; Notable for the following components:      Result Value   Creatinine, Ser 1.59 (*)    Calcium  10.6 (*)    GFR, Estimated 37 (*)    All other components within normal limits  URINALYSIS, ROUTINE W REFLEX MICROSCOPIC - Abnormal; Notable for the following components:   Bilirubin Urine SMALL (*)    Ketones, ur TRACE (*)    Protein, ur TRACE (*)    Leukocytes,Ua SMALL (*)    All other components within normal limits  CBC WITH DIFFERENTIAL/PLATELET  LIPASE, BLOOD    EKG: None  Radiology: CT ABDOMEN PELVIS W CONTRAST Result Date: 07/27/2024 CLINICAL DATA:  Lower abdominal cramping, nausea and vomiting EXAM: CT ABDOMEN AND PELVIS WITH CONTRAST TECHNIQUE: Multidetector CT imaging of the abdomen and pelvis was performed using the standard protocol following bolus administration of intravenous contrast. RADIATION DOSE REDUCTION: This exam was performed according to the departmental dose-optimization program which includes automated exposure control, adjustment of the mA and/or kV according to patient size and/or use of iterative reconstruction technique. CONTRAST:  80mL OMNIPAQUE  IOHEXOL  300 MG/ML  SOLN COMPARISON:  06/26/2024 FINDINGS: Lower chest: No acute pleural or parenchymal lung disease. Hepatobiliary: No focal liver abnormality is seen. No gallstones, gallbladder wall thickening, or biliary dilatation. Pancreas: Unremarkable. No pancreatic ductal dilatation or surrounding inflammatory changes. Spleen: Normal in size without focal abnormality. Adrenals/Urinary Tract: Adrenal glands are unremarkable. Kidneys are normal, without renal calculi, focal lesion, or  hydronephrosis. Bladder is unremarkable. Stomach/Bowel: No bowel obstruction or ileus. Moderate stool within the colon consistent with constipation. Normal appendix right lower quadrant. There is short segment wall thickening of the mid sigmoid colon, with mild surrounding fat stranding, consistent with focal colitis or diverticulitis. No perforation, fluid collection, or abscess. Vascular/Lymphatic: Aortic atherosclerosis. No enlarged abdominal or pelvic lymph nodes. Reproductive: Uterus and bilateral adnexa are unremarkable. Other: No free fluid or free intraperitoneal gas. No abdominal wall hernia. Musculoskeletal: No acute or destructive bony abnormalities. Reconstructed images demonstrate no additional findings. IMPRESSION: 1. Segmental wall thickening of the mid sigmoid colon, consistent with colitis or diverticulitis. No perforation, fluid collection, or abscess. 2. Moderate fecal retention throughout the colon consistent with constipation. No obstruction or ileus. 3.  Aortic Atherosclerosis (ICD10-I70.0). Electronically Signed   By: Ozell Daring M.D.   On: 07/27/2024 19:40     Procedures   Medications Ordered in the  ED  fentaNYL  (SUBLIMAZE ) injection 25 mcg (25 mcg Intravenous Patient Refused/Not Given 07/27/24 2030)  ondansetron  (ZOFRAN ) injection 4 mg (4 mg Intravenous Given 07/27/24 1750)  morphine  (PF) 4 MG/ML injection 4 mg (4 mg Intravenous Given 07/27/24 1750)  sodium chloride  0.9 % bolus 1,000 mL (0 mLs Intravenous Stopped 07/27/24 1946)  morphine  (PF) 4 MG/ML injection 4 mg (4 mg Intravenous Given 07/27/24 1858)  iohexol  (OMNIPAQUE ) 300 MG/ML solution 80 mL (80 mLs Intravenous Contrast Given 07/27/24 1914)  metroNIDAZOLE (FLAGYL) tablet 500 mg (500 mg Oral Given 07/27/24 2026)  ciprofloxacin (CIPRO) tablet 500 mg (500 mg Oral Given 07/27/24 2026)                                    Medical Decision Making Amount and/or Complexity of Data Reviewed Labs: ordered. Radiology:  ordered.  Risk Prescription drug management.   This patient presents to the ED for concern of abdominal pain.  Differential diagnosis includes abdominal mass, diverticulitis, urolithiasis, pyelonephritis   Lab Tests:  I Ordered, and personally interpreted labs.  The pertinent results include:  CBC unremarkable, CMP with worsening renal function with creatinine at 1.59, UA unremarkable and lipase normal.   Imaging Studies ordered:  I ordered imaging studies including CT abdomen pelvis I independently visualized and interpreted imaging which showed segmental wall thickening of the mid sigmoid colon, consistent with colitis or diverticulitis. No perforation, fluid collection, or abscess. 2. Moderate fecal retention throughout the colon consistent with constipation. No obstruction or ileus. I agree with the radiologist interpretation   Medicines ordered and prescription drug management:  I ordered medication including morphine , Zofran  for pain, nausea Reevaluation of the patient after these medicines showed that the patient improved I have reviewed the patients home medicines and have made adjustments as needed   Problem List / ED Course:  Patient with past history significant for colorectal cancer presents the emergency department concerns of abdominal pain and constipation.  Endorses ongoing abdominal pain for the last 6 months with some nausea but no vomiting.  Denies any fever, weight loss, bodyaches or chills.  States last bowel movement was possibly several days ago but she is unsure of the exact date.  Endorses some prior rectal bleeding about 2 weeks ago.  This is now resolved. Exam reveals focal left lower quadrant tenderness with guarding present.  Denies any history of diverticulitis.  Otherwise well-appearing with normal vitals. Lab workup shows likely declining renal function with creatinine at 1.59 and GFR 37.  Fluid bolus initiated due to possible dehydration and patient  will require CT imaging with contrast.  Labs otherwise unremarkable. CT consistent with likely diverticulitis with segmental wall thickening of the mid sigmoid colon. Givne patient's reported level of pain, will initiate antibiotic therapy and advised close follow up with PCP. Flagyl and Cipro sent to pharmacy. Discharged home in stable condition.   Social Determinants of Health:  None  Final diagnoses:  Diverticulitis of sigmoid colon    ED Discharge Orders          Ordered    metroNIDAZOLE (FLAGYL) 500 MG tablet  3 times daily        07/27/24 2010    ciprofloxacin (CIPRO) 500 MG tablet  Every 12 hours        07/27/24 2010    methocarbamol  (ROBAXIN ) 500 MG tablet  2 times daily        07/27/24 2010  Cecily Legrand LABOR, PA-C 07/27/24 2312    Mannie Pac T, DO 08/01/24 [phone]

## 2024-07-27 NOTE — Discharge Instructions (Addendum)
 You were seen in the ER today for concerns of abdominal pain. Your labs were reassuring but your CT scan appears to show diverticulitis, or inflammation and infection, in your sigmoid colon. This corresponds with the area of pain you have been feeling on the left lower side. I have started you on a course of antibiotics to help manage this condition. Follow up with your primary care provider for further evaluation. Return to the ER for any concerns of new or worsening symptoms.
